# Patient Record
Sex: Female | Born: 1940 | Hispanic: Refuse to answer | Marital: Married
Health system: Midwestern US, Academic
[De-identification: ages and names within clinical notes are randomized; demographics above are authoritative.]

---

## 2016-09-04 IMAGING — CR LOW_EXM
3 series · 3 of 3 positions shown · non-contrast
Comparison: none

[foot]
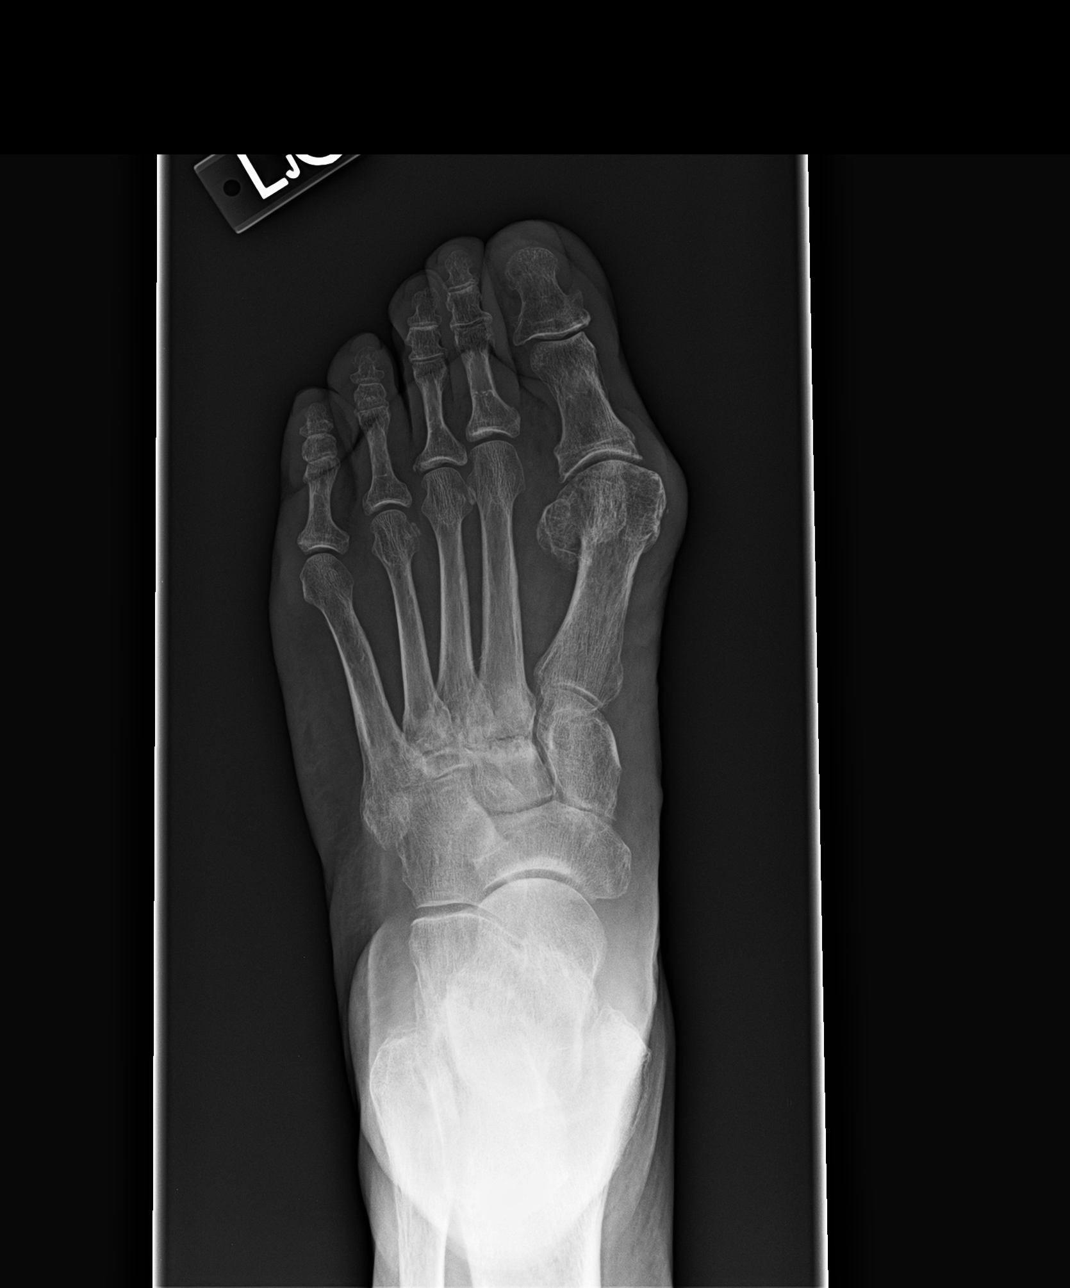

[foot obl]
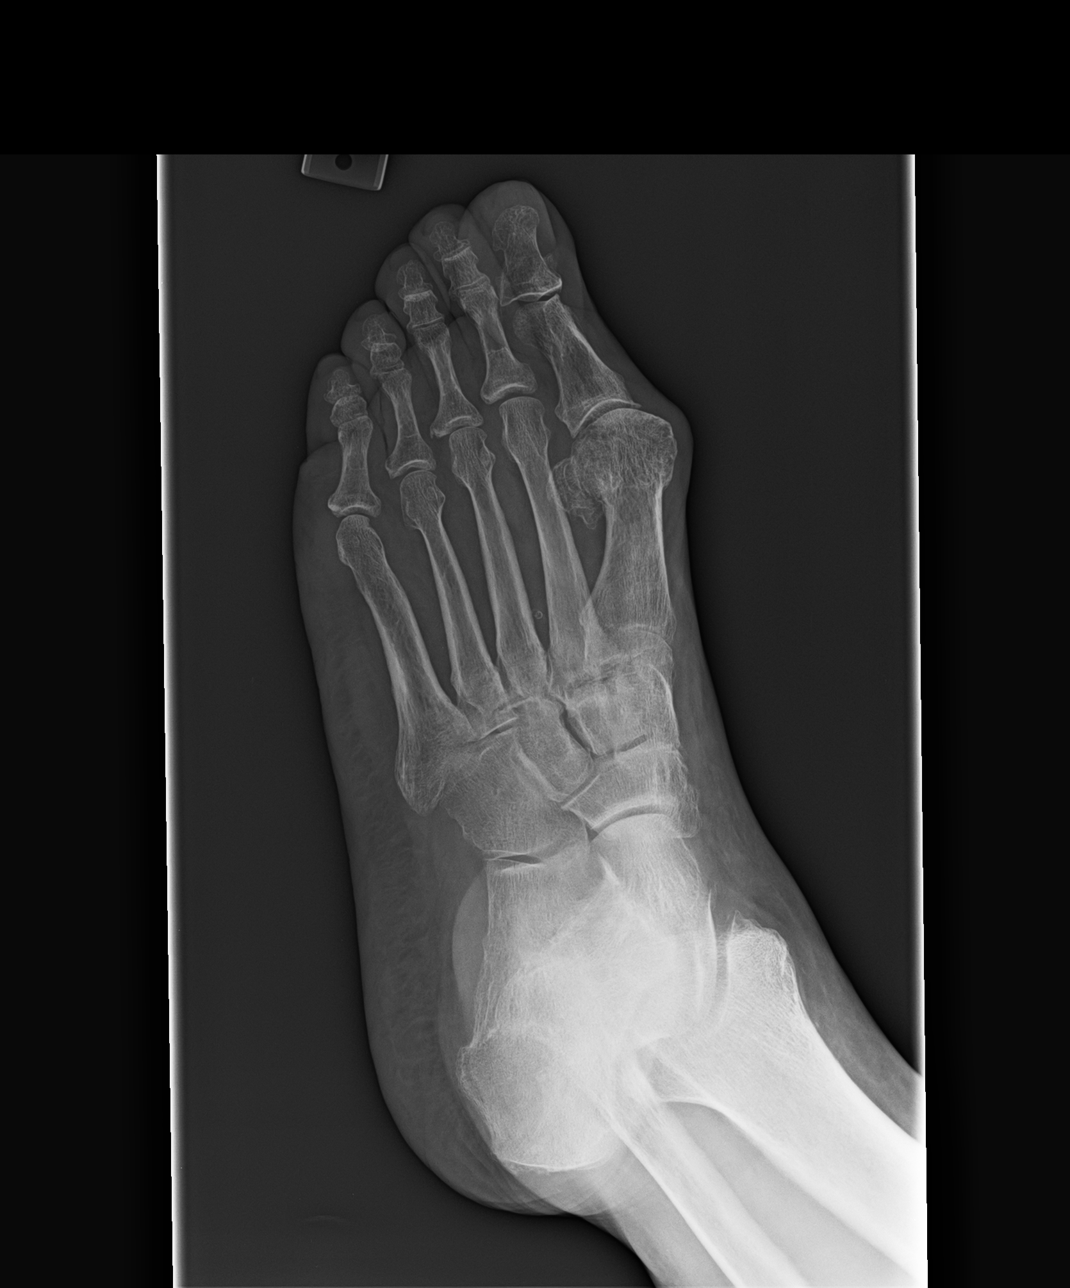

[foot lat]
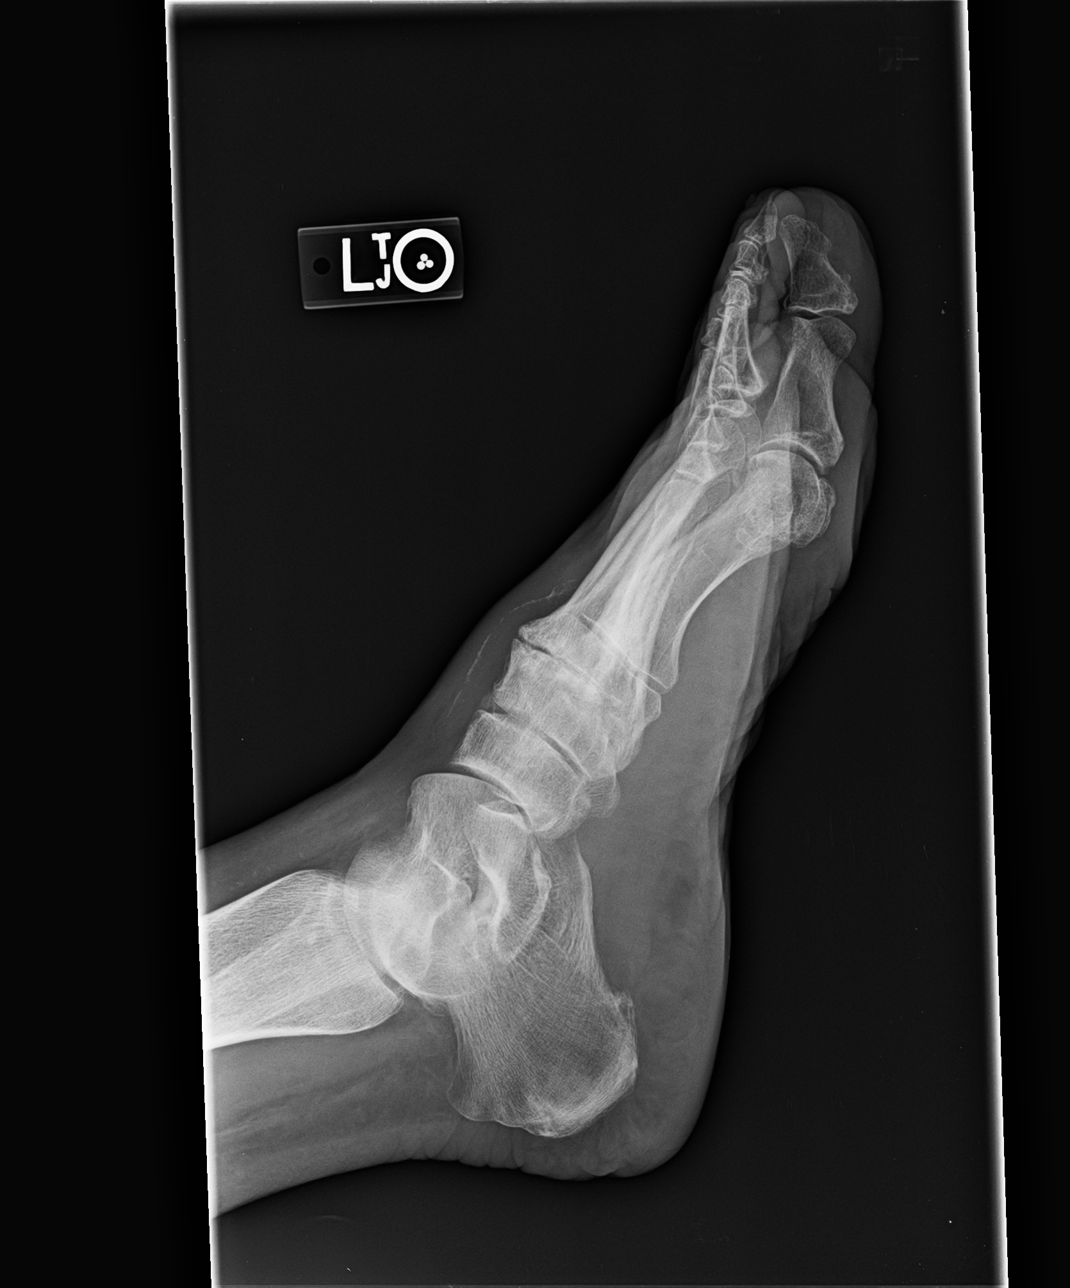

[3 of 3 positions shown; findings below may reference images not displayed]

EXAM
RADIOLOGICAL EXAMINATION, FOOT; COMPLETE
3 OR MORE VIEWS CPT 49493

INDICATION
LEFT FOOT PAIN
PT STATES HAS LEFT FOOT PAIN THROUGHOUT. NO KNOWN INJURY. TJ

TECHNIQUE
3 views of the left foot were acquired.

COMPARISONS
Previous examination dated 06/14/2015.

FINDINGS
There are no fractures or subluxations of the foot. Hallux valgus deformity is redemonstrated.
There is bony demineralization identified. There are advanced degenerative changes along the
midfoot involving the tarsometatarsal joints. Degenerative changes of the metacarpophalangeal and
interphalangeal joints are noted as well. Plantar calcaneal spur is redemonstrated.
There is soft tissue swelling along the dorsal aspect of the foot. Vascular calcifications are
noted as well. There are no abnormal masses.

IMPRESSION
Degenerative changes along the midfoot as previously described. Hallux valgus deformity. Plantar
calcaneal spur. Soft tissue swelling along the dorsal aspect of the left foot. No acute fractures.

## 2017-01-03 IMAGING — CR LOW_EXM
2 series · 2 of 2 positions shown · non-contrast
Comparison: none

[knee lat]
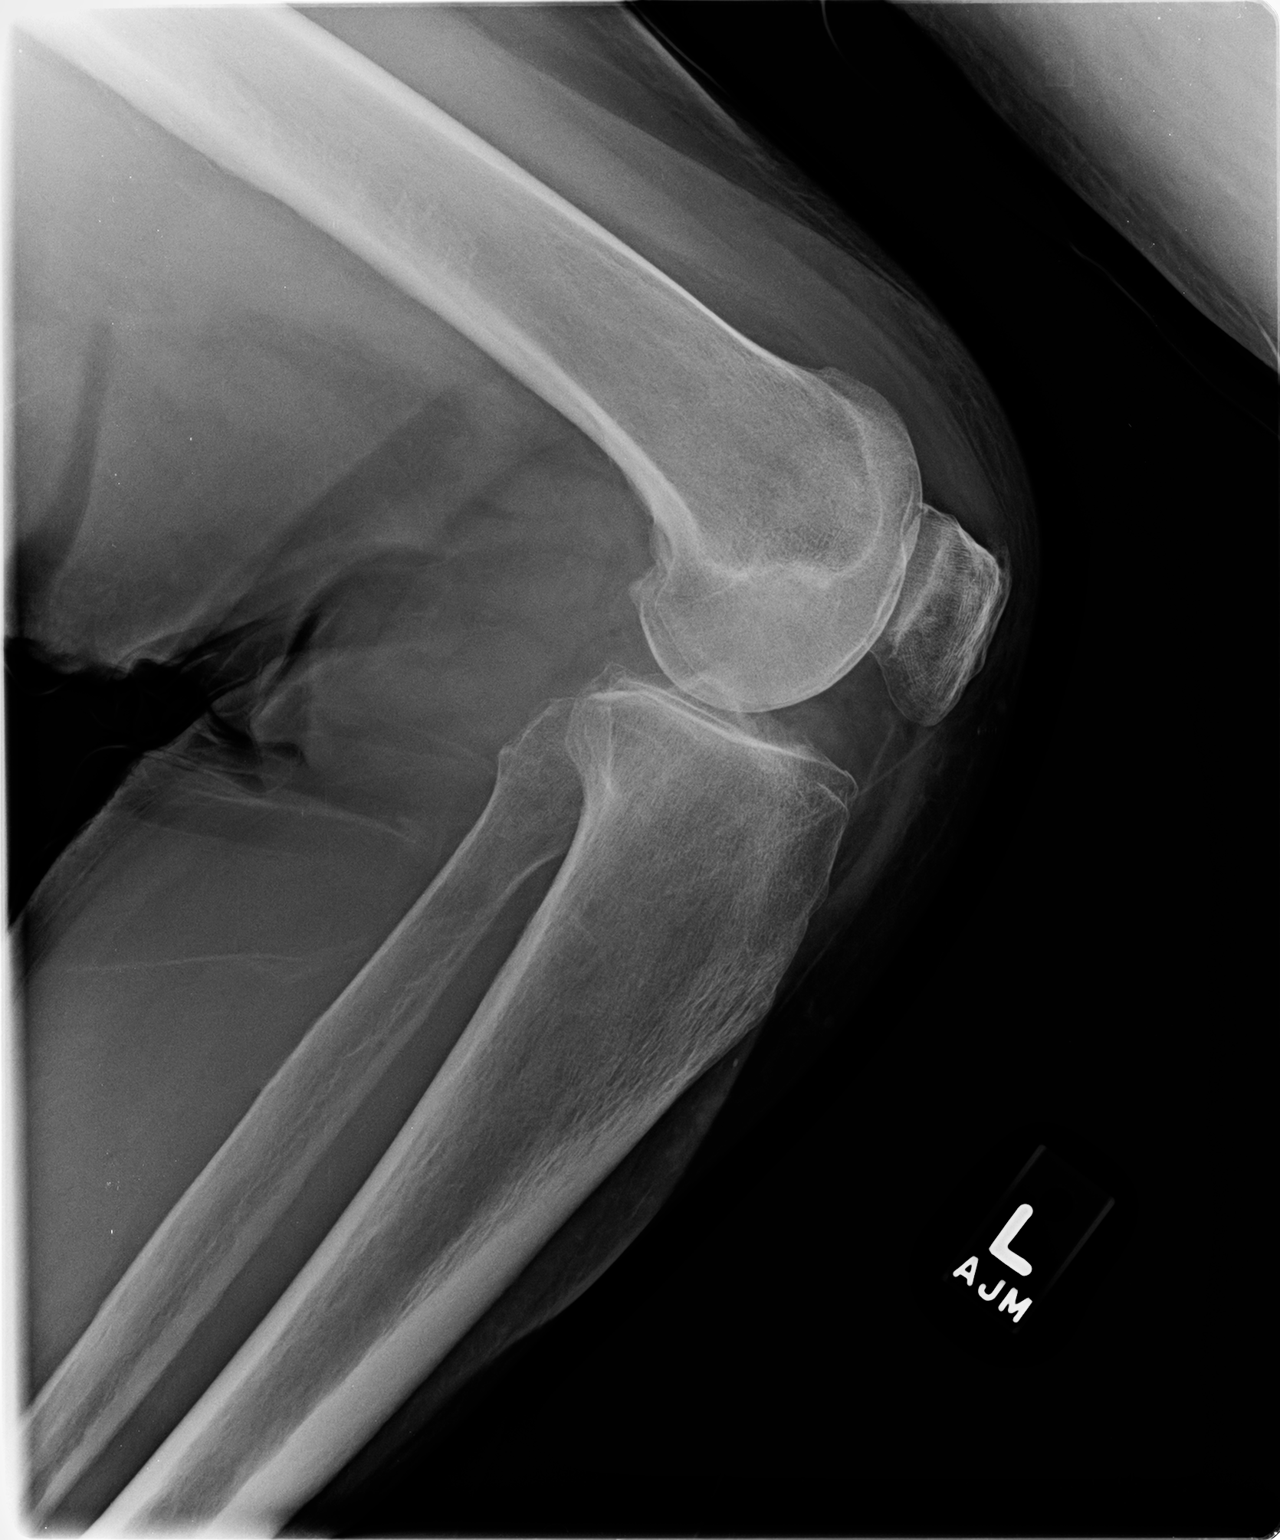

[knee sunrise]
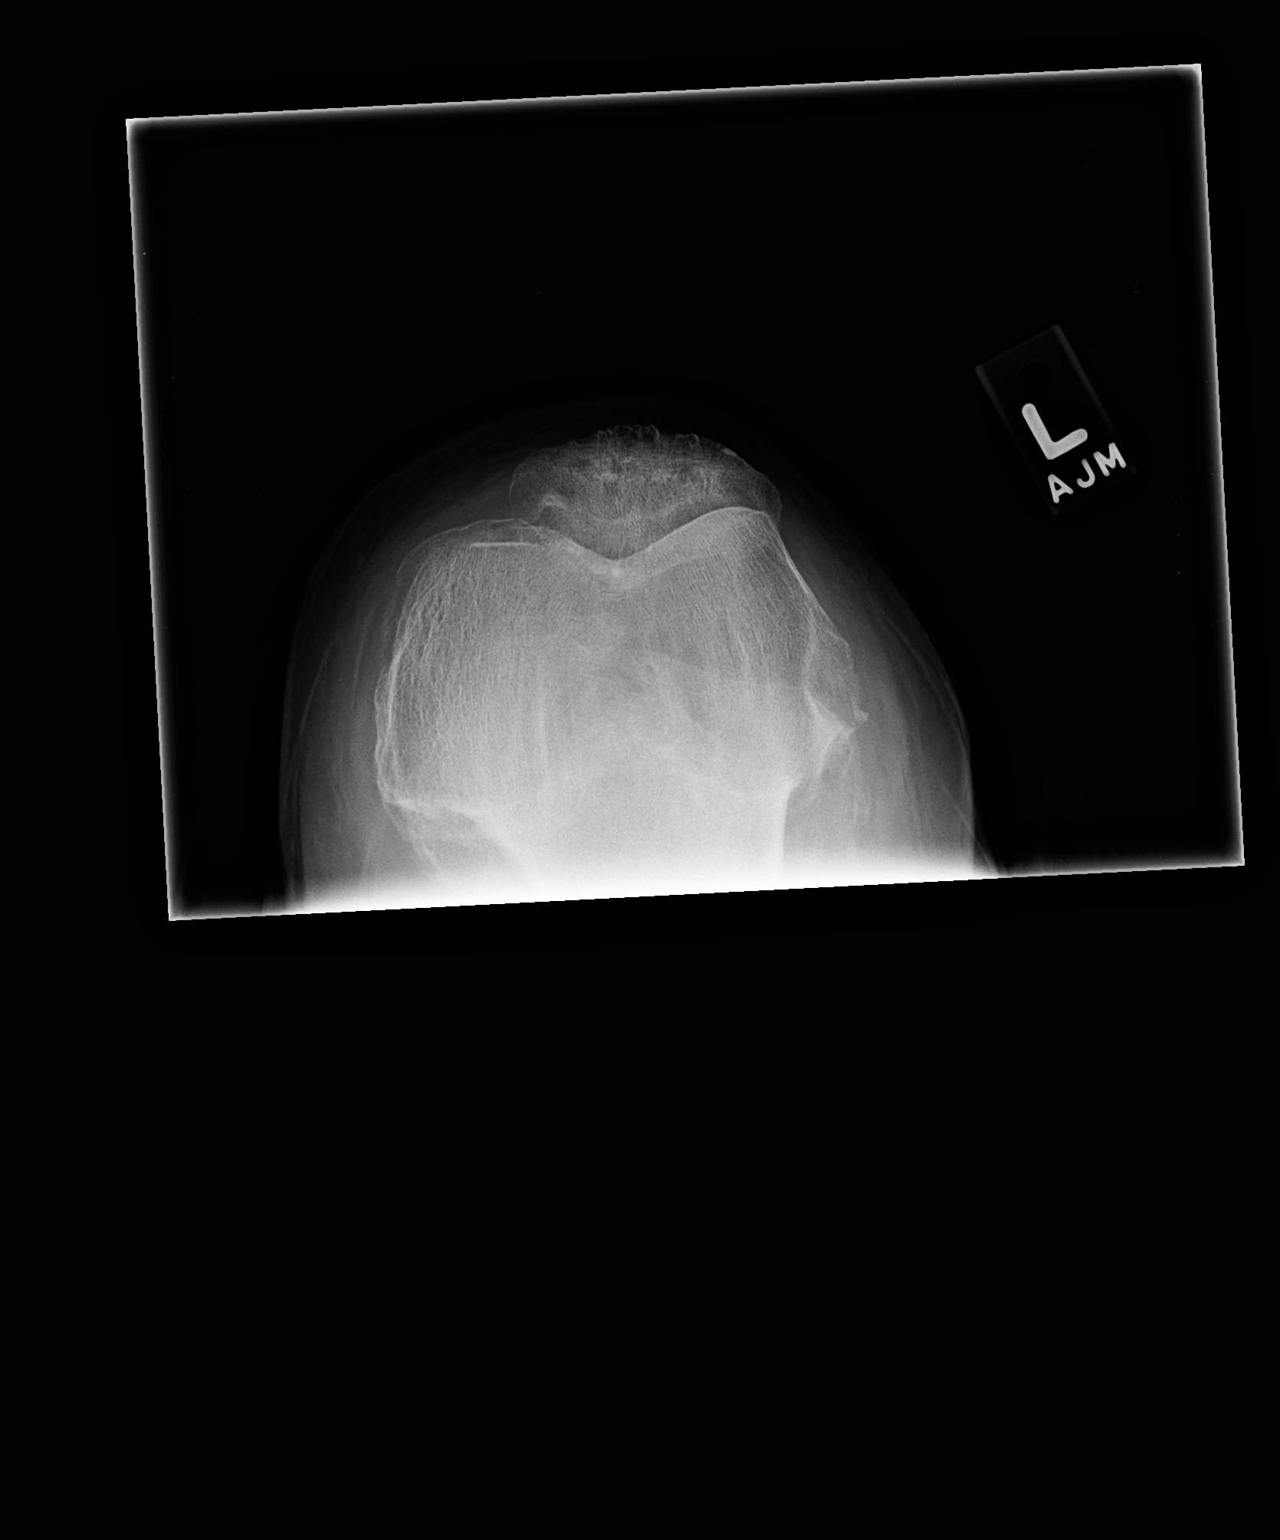

[2 of 2 positions shown; findings below may reference images not displayed]

DIAGNOSTIC STUDIES

EXAM

Bilateral knees:

INDICATION

bilateral knee pain
BILATERAL KNEE AND HIP PAIN

FINDINGS

Left knee: No acute fracture or dislocation. No significant joint effusion. Moderate loss of joint
space with subchondral sclerosis with patellofemoral compartment.

Right knee: No acute fracture or dislocation. No significant joint effusion. Moderate loss of
joint space with subchondral sclerosis within the patellofemoral compartment.

IMPRESSION

Moderate degree of osteoarthritic changes within the patellofemoral compartment bilaterally.

## 2017-01-03 IMAGING — CR LOW_EXM
1 series · 1 of 1 positions shown · non-contrast
Comparison: none

[ap bilateral standing knees]
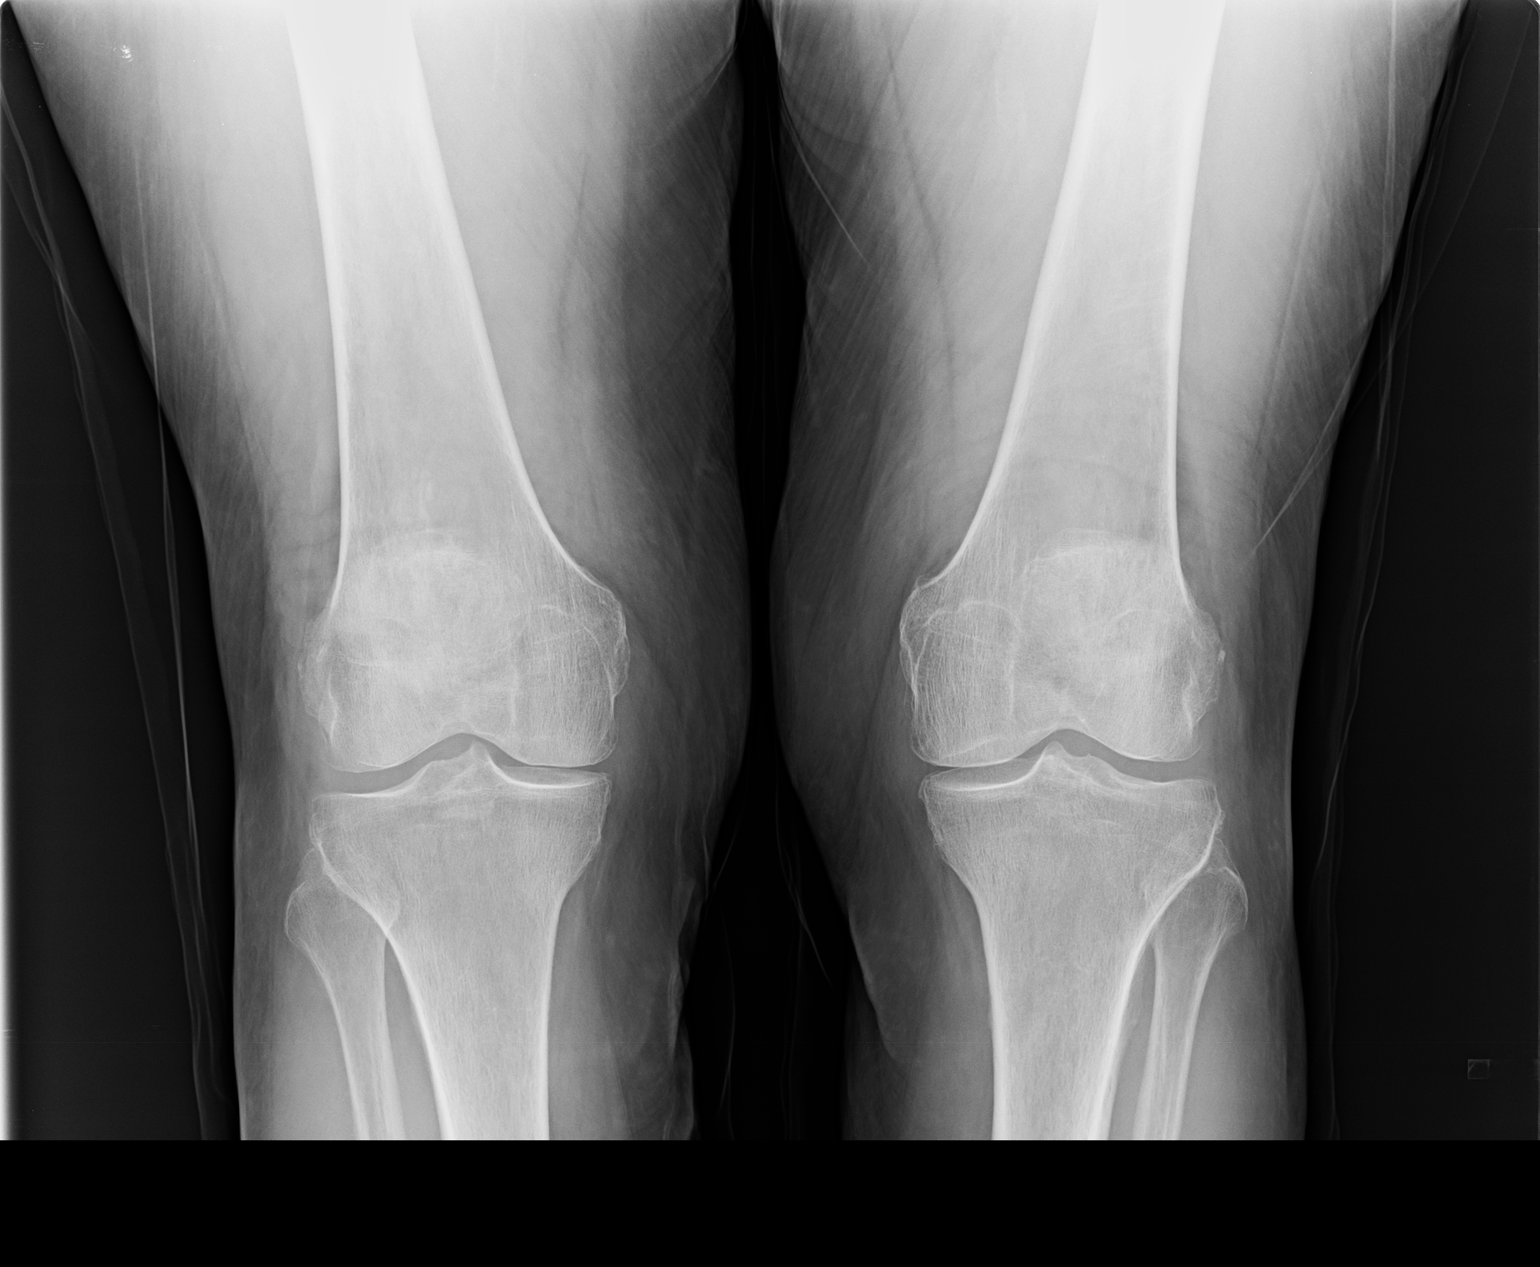

[1 of 1 positions shown; findings below may reference images not displayed]

DIAGNOSTIC STUDIES

EXAM

Bilateral knees:

INDICATION

bilateral knee pain
BILATERAL KNEE AND HIP PAIN

FINDINGS

Left knee: No acute fracture or dislocation. No significant joint effusion. Moderate loss of joint
space with subchondral sclerosis with patellofemoral compartment.

Right knee: No acute fracture or dislocation. No significant joint effusion. Moderate loss of
joint space with subchondral sclerosis within the patellofemoral compartment.

IMPRESSION

Moderate degree of osteoarthritic changes within the patellofemoral compartment bilaterally.

## 2017-01-03 IMAGING — CR LOW_EXM
2 series · 2 of 2 positions shown · non-contrast
Comparison: none

[knee lat]
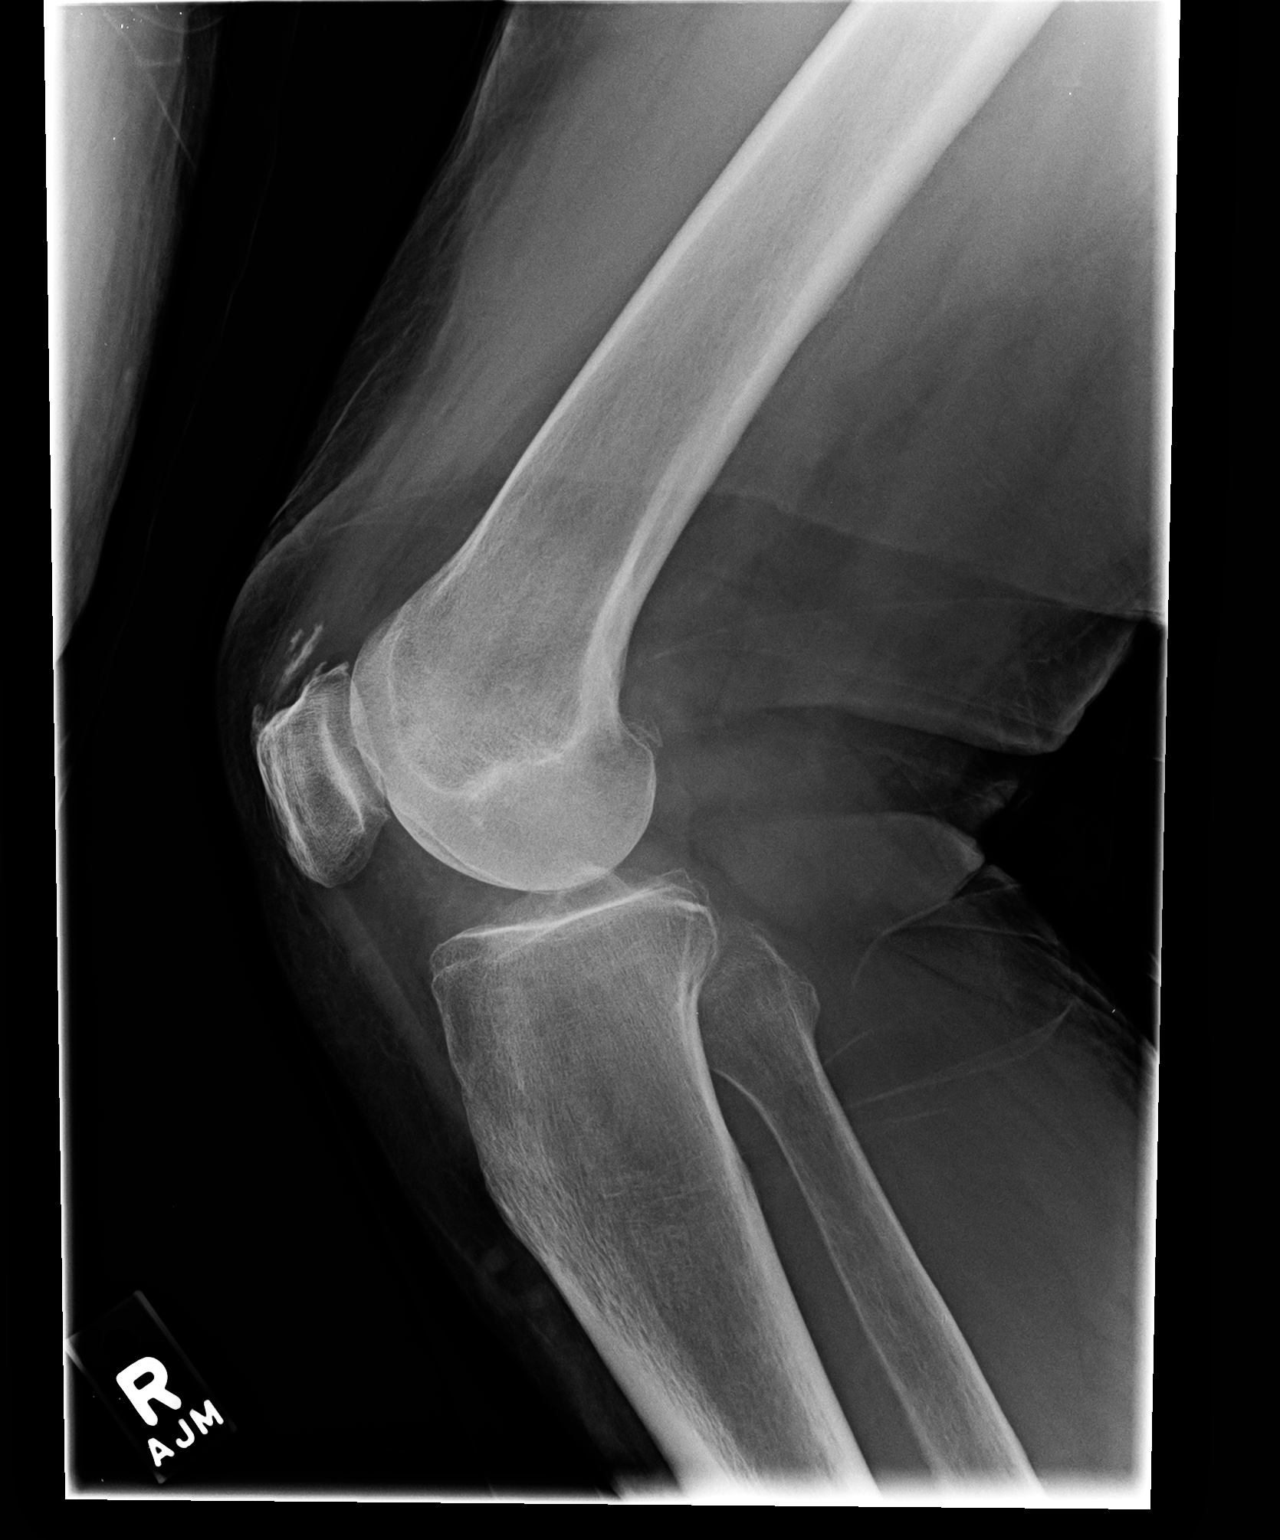

[knee sunrise]
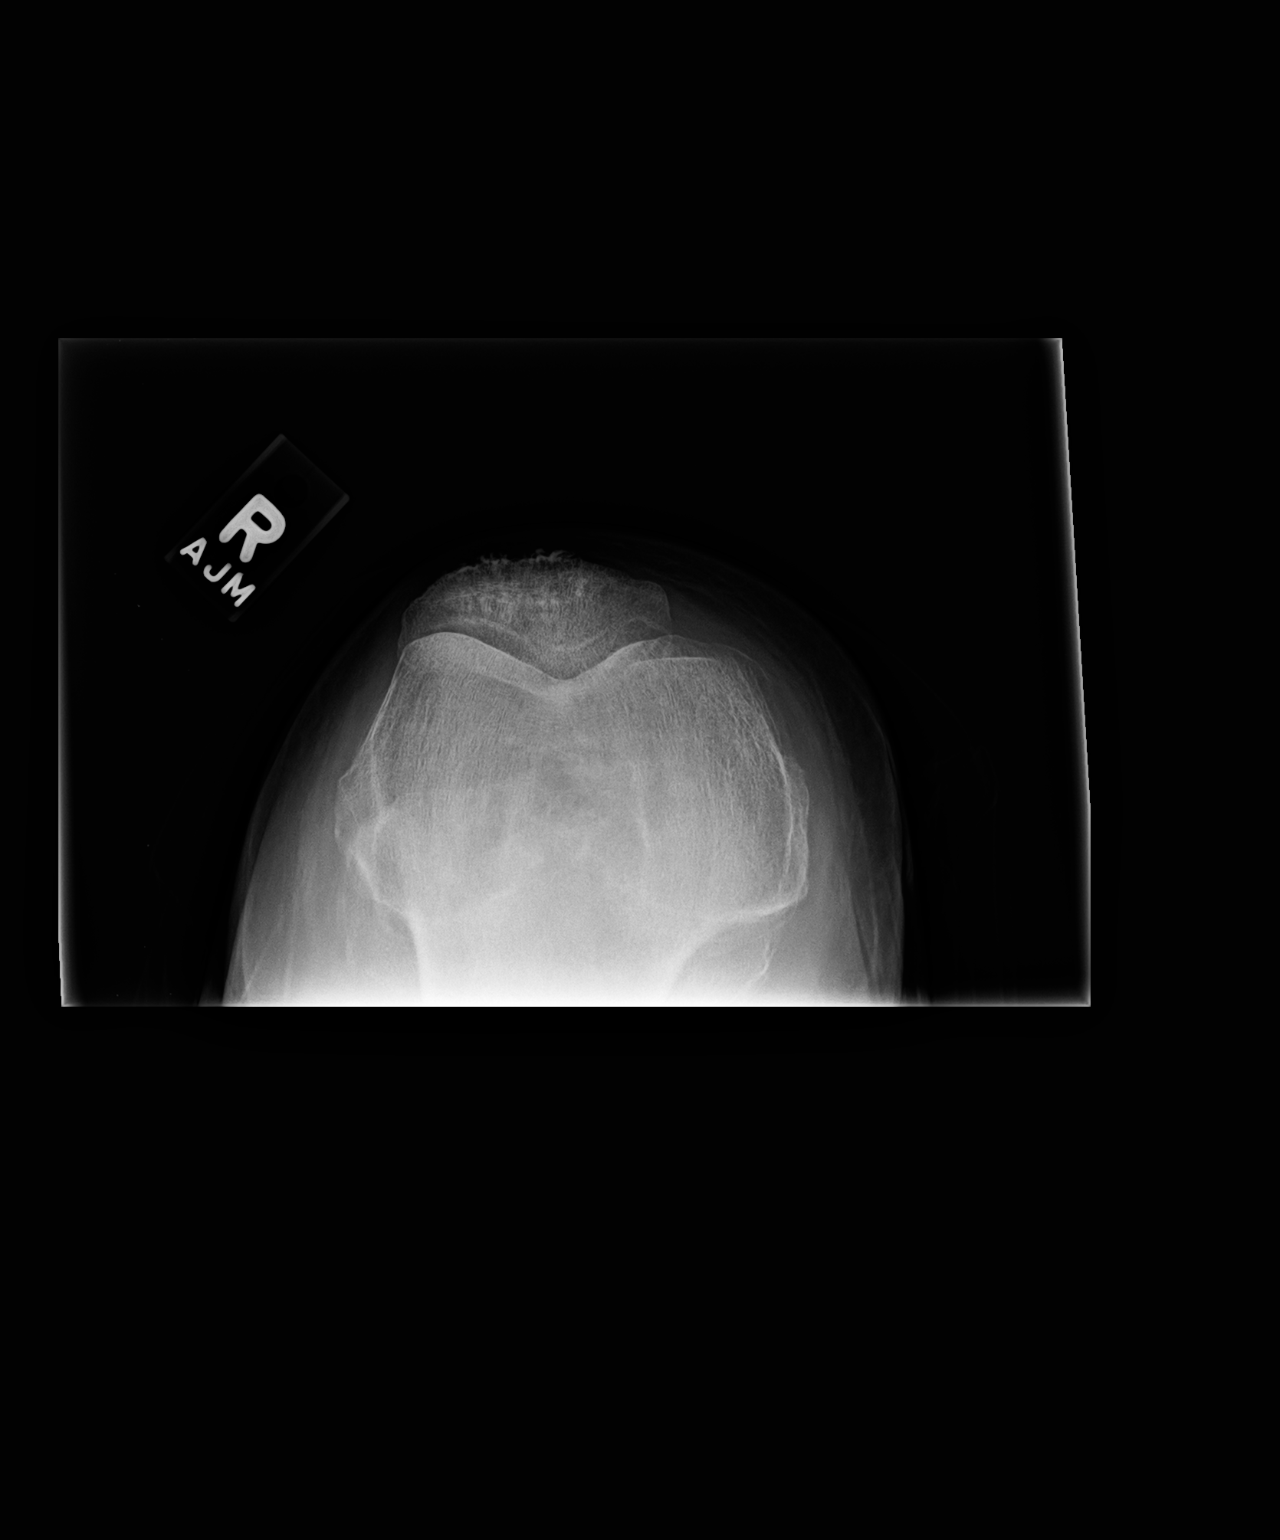

[2 of 2 positions shown; findings below may reference images not displayed]

DIAGNOSTIC STUDIES

EXAM

Bilateral knees:

INDICATION

bilateral knee pain
BILATERAL KNEE AND HIP PAIN

FINDINGS

Left knee: No acute fracture or dislocation. No significant joint effusion. Moderate loss of joint
space with subchondral sclerosis with patellofemoral compartment.

Right knee: No acute fracture or dislocation. No significant joint effusion. Moderate loss of
joint space with subchondral sclerosis within the patellofemoral compartment.

IMPRESSION

Moderate degree of osteoarthritic changes within the patellofemoral compartment bilaterally.

## 2017-11-21 IMAGING — CR LOW_EXM
5 series · 5 of 5 positions shown · non-contrast
Comparison: none

[femur ap proximal]
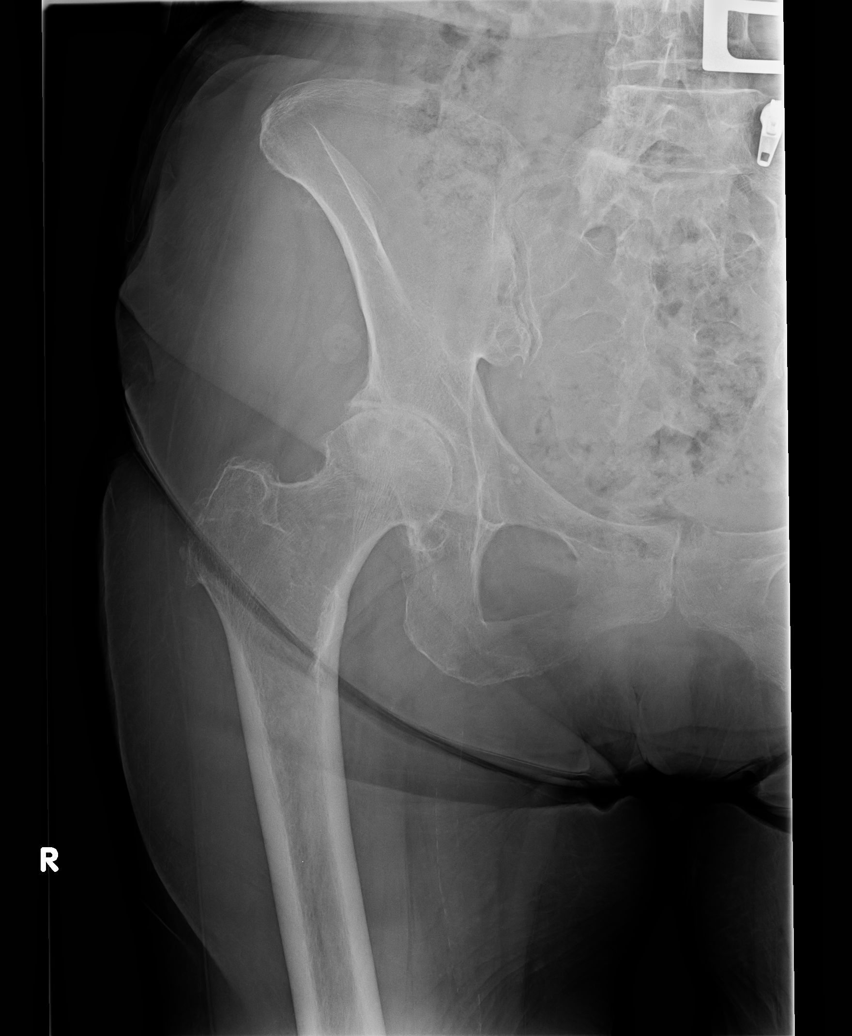

[femur ap distal (1 of 2)]
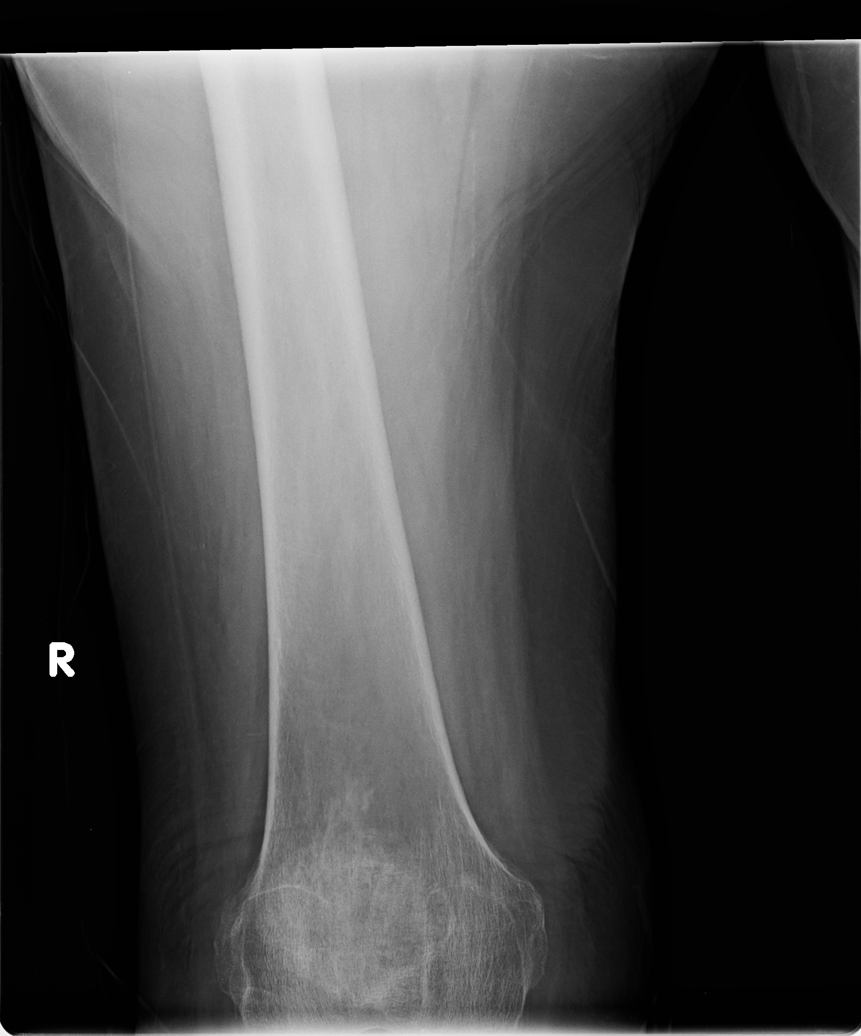

[femur ap distal (2 of 2)]
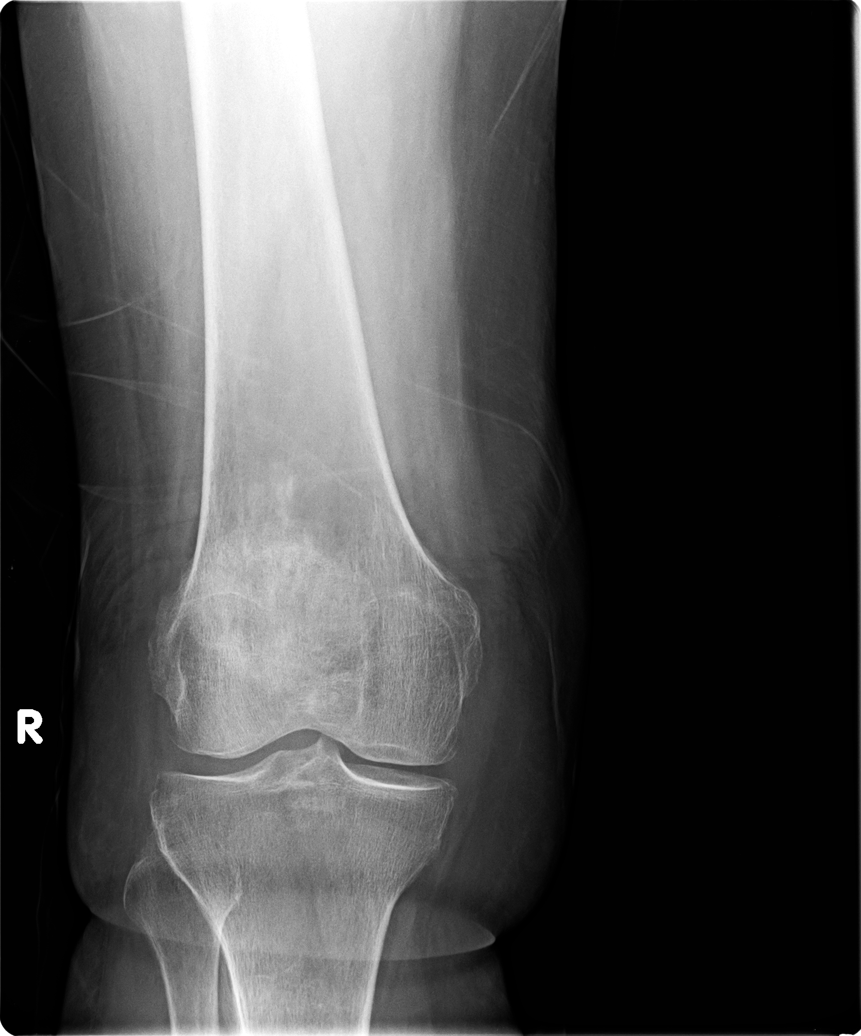

[femur lat proximal]
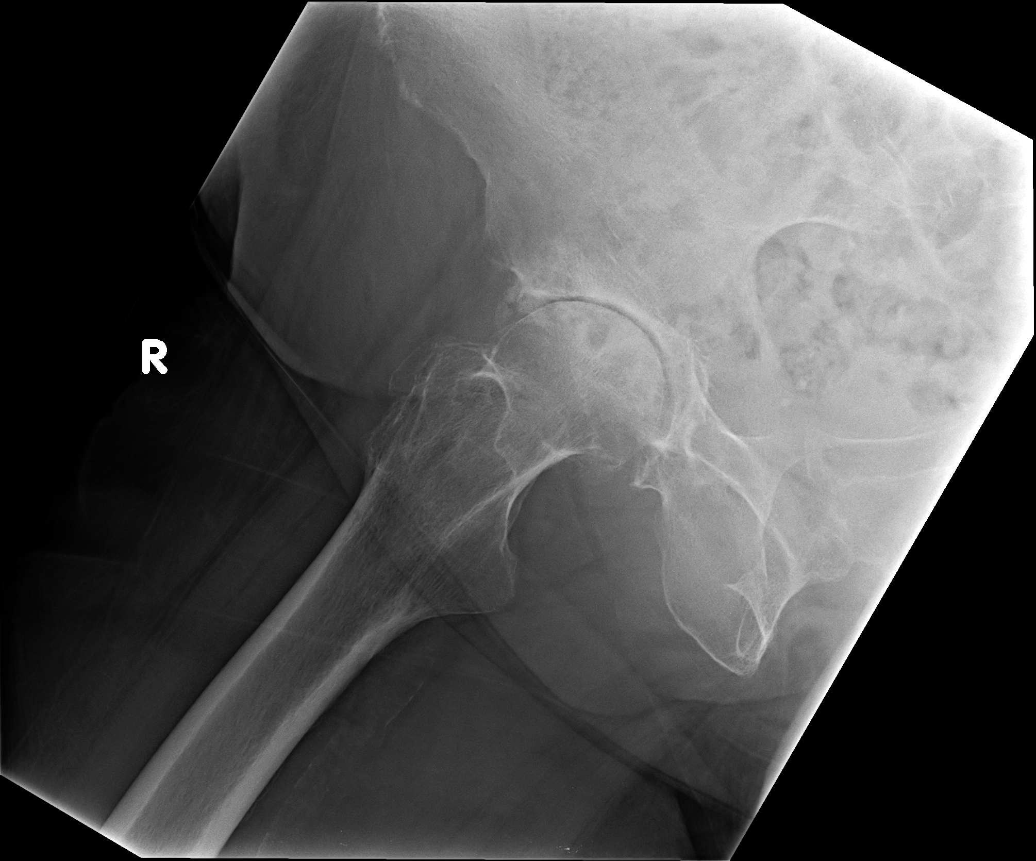

[femur lat distal]
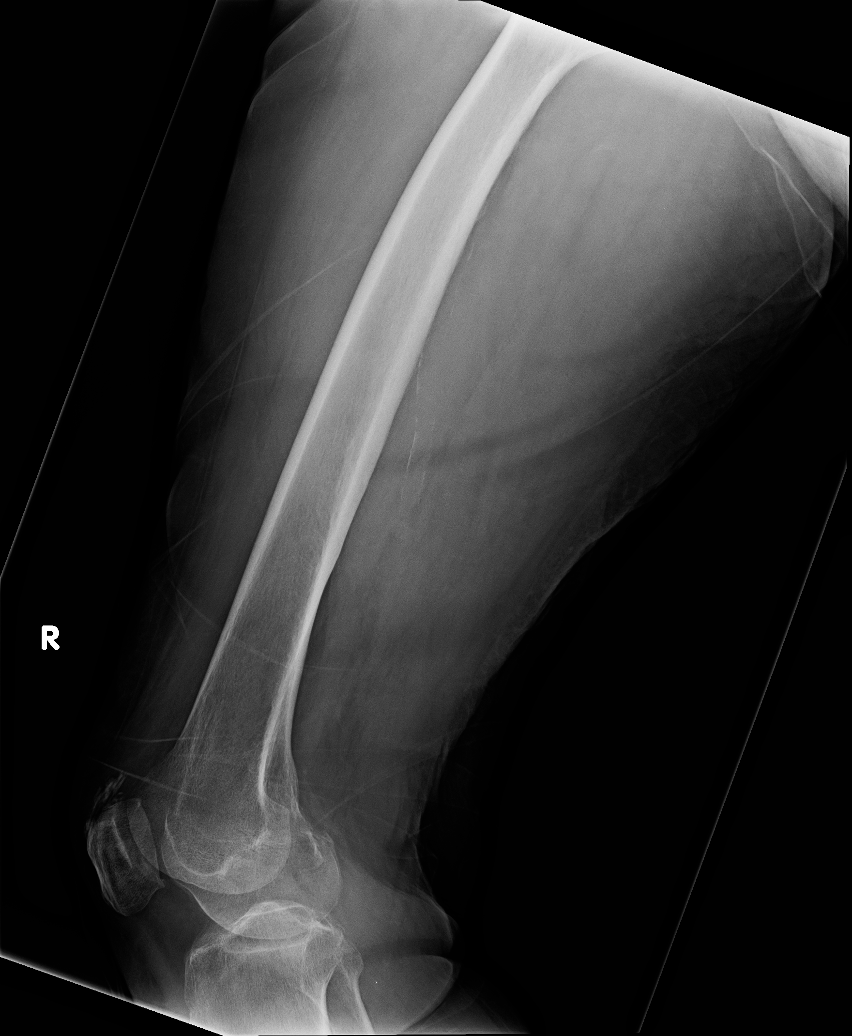

[5 of 5 positions shown; findings below may reference images not displayed]

DIAGNOSTIC STUDIES

EXAM

Two-view right femur

INDICATION

Right femur pain

TECHNIQUE

Frontal and lateral views

COMPARISONS

Right hip and knee radiographs July 16, 2017

FINDINGS

Right hip and knee osteoarthritis are present, hip is bone-on-bone articulation. Diffuse
osteopenia. No acute displaced fracture. Cannot exclude femoral head osteonecrosis given the mixed
sclerotic and lucent appearance of the superior femoral head. No collapse.

IMPRESSION

Bone-on-bone right hip osteoarthritis without acute displaced fracture or malalignment.

## 2018-04-27 IMAGING — CR ABDOMEN
2 series · 2 of 2 positions shown · non-contrast
Comparison: none

PROCEDURE: ABDOMEN
HISTORY: Worsening abd pain, nausea. Diarrhea yesterday. Pain all over per pt. Hx gastric bypass,
hysterectomy, appy per pt.

[abdomen supine kub (1 of 2)]
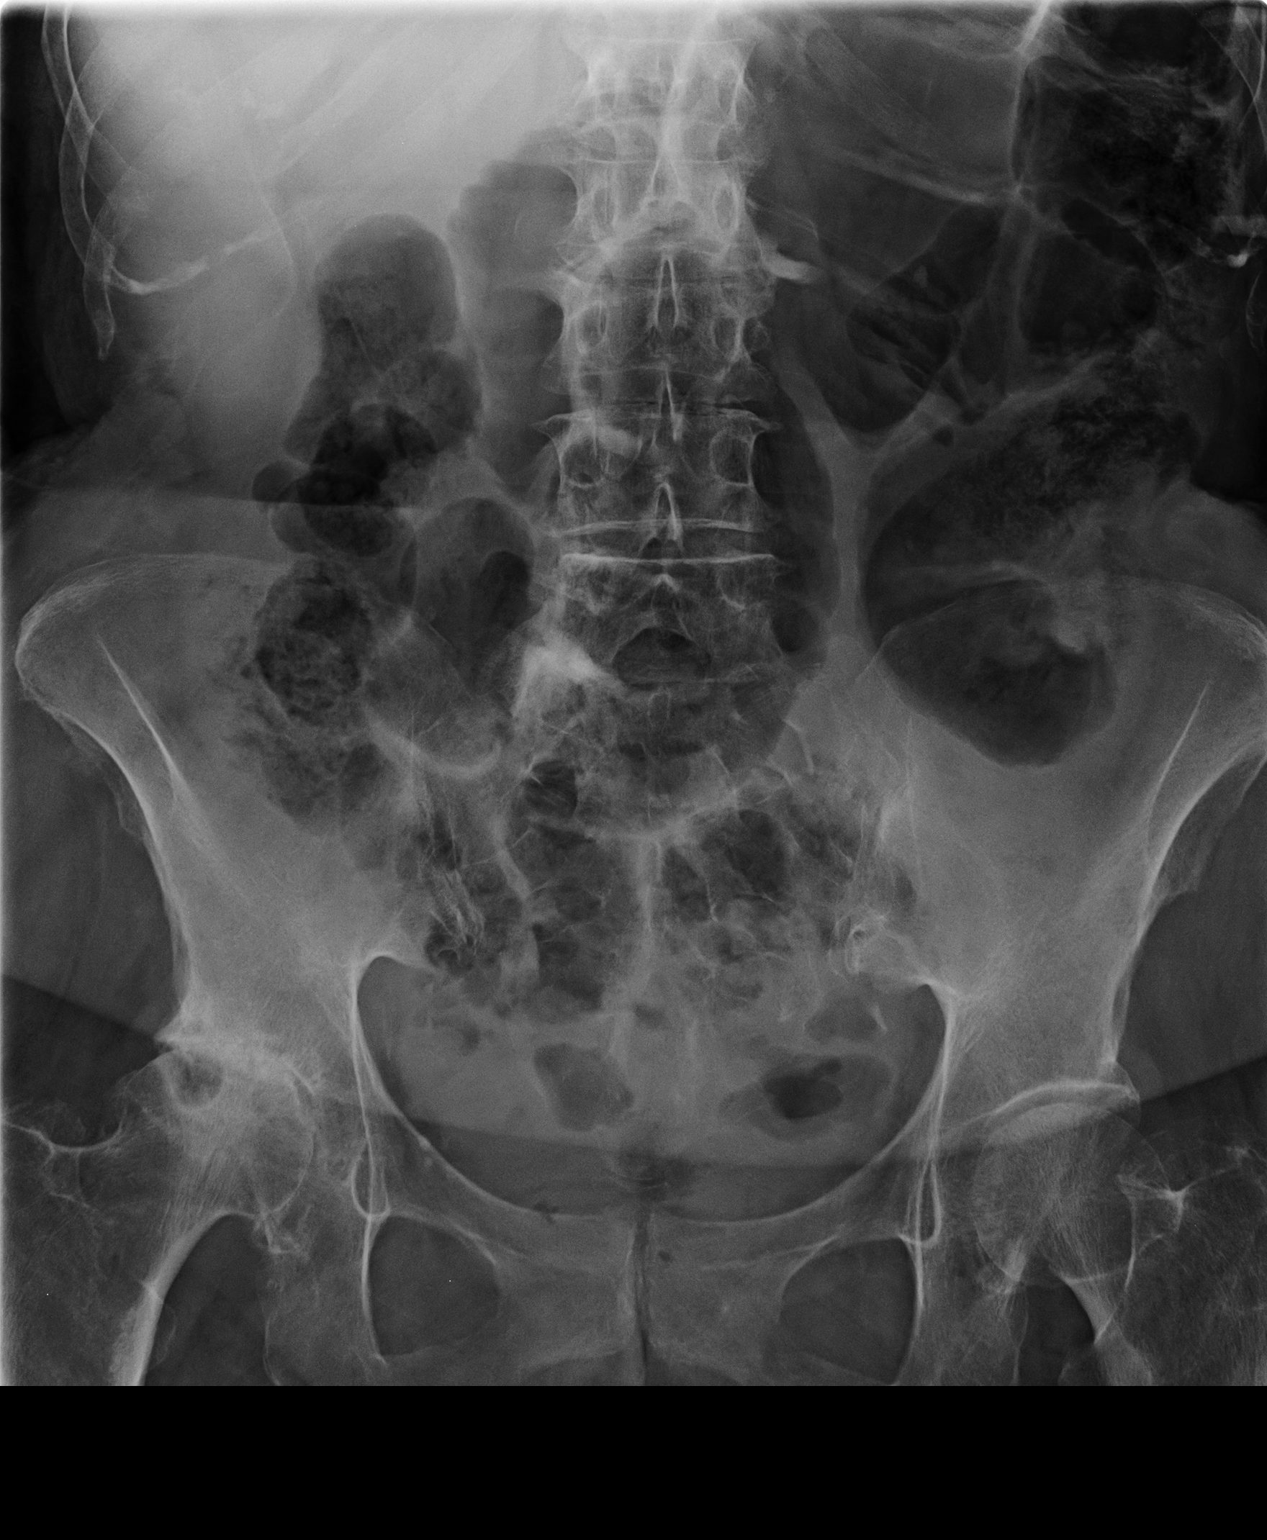

[abdomen supine kub (2 of 2)]
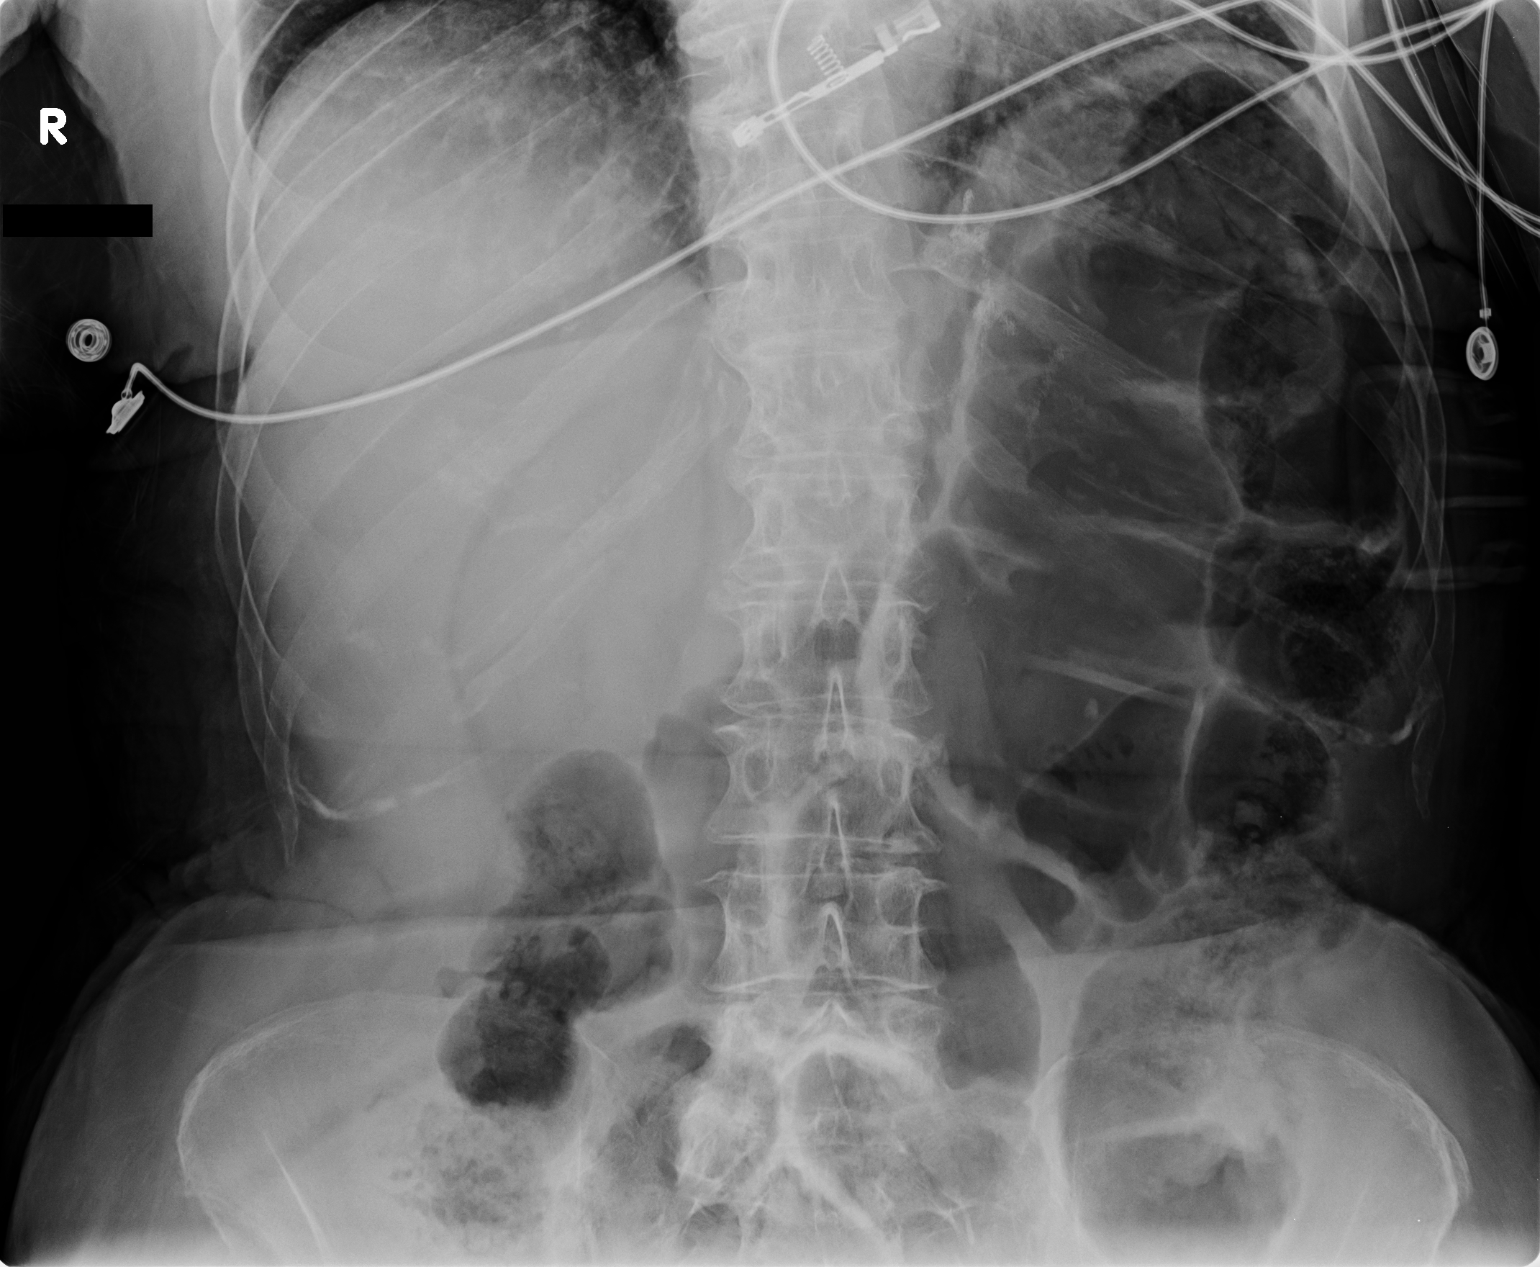

[2 of 2 positions shown; findings below may reference images not displayed]

FINDINGS: 2 images of the abdomen without comparison shows a nonspecific bowel gas
pattern without pathologic dilation. Intraluminal air filled loops of bowel are seen down to
the level of rectum. Scattered moderate amount of stool is seen throughout the colon and
into the rectal vault. No pathologic calcifications are seen. No free air seen by plain film
exam. Severe degenerative changes are seen of the right hip with subcortical cyst.
IMPRESSION: Nonobstructive bowel gas pattern without free air.

Tech Notes:

ALL OVER ABD PAIN, DIARRHEA. WORSE AT THIS TIME.  HX HYSTERECTOMY, GASTRIC BYPASS, APPY PER PT.

## 2018-04-28 IMAGING — US ABDLM
1 series · 14 of 25 positions shown · non-contrast
Comparison: none

[Series 1: us abdomen limited · 14 of 75 slices shown]
[im 1/75]
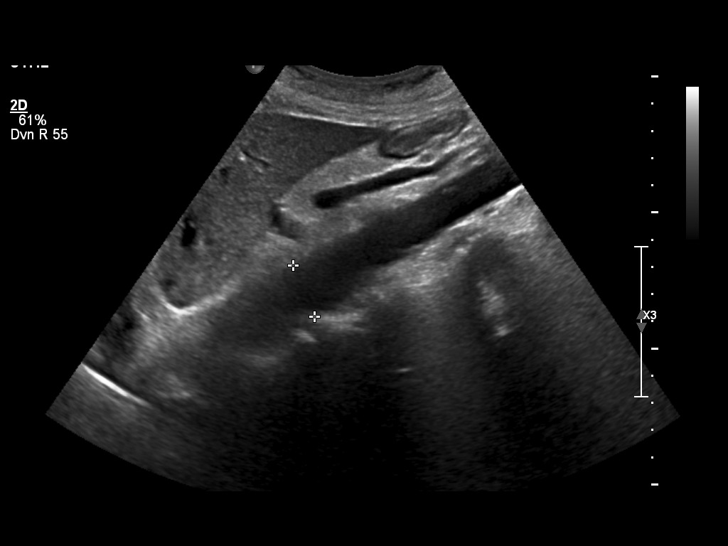
[im 7/75]
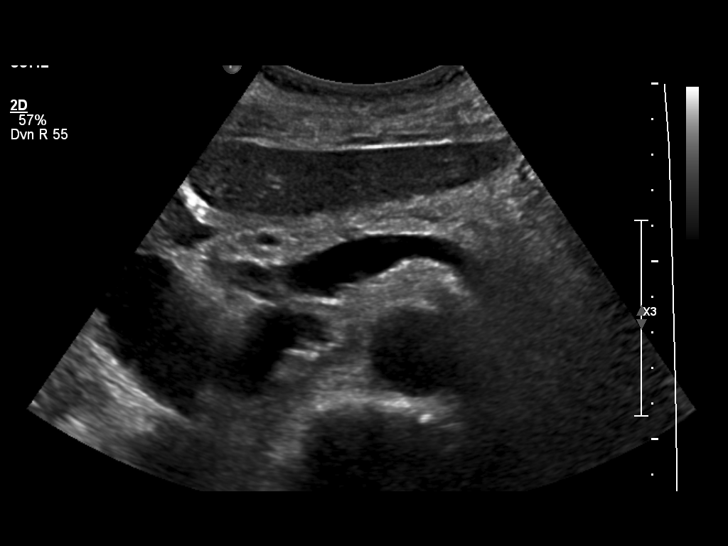
[im 13/75]
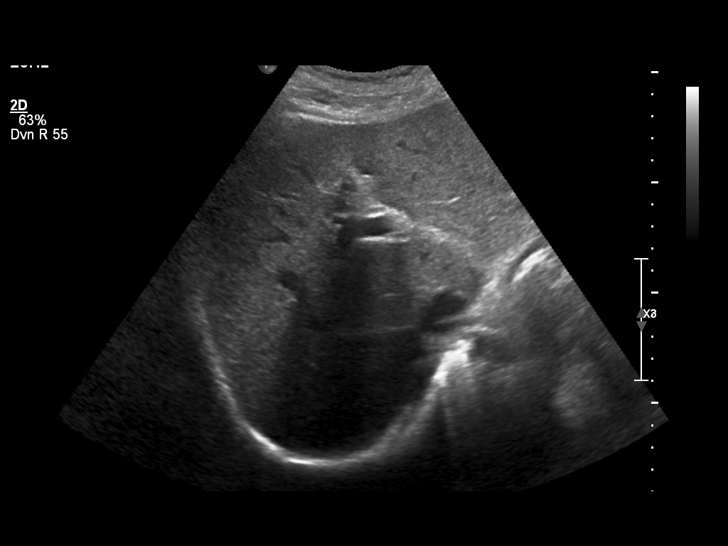
[im 19/75]
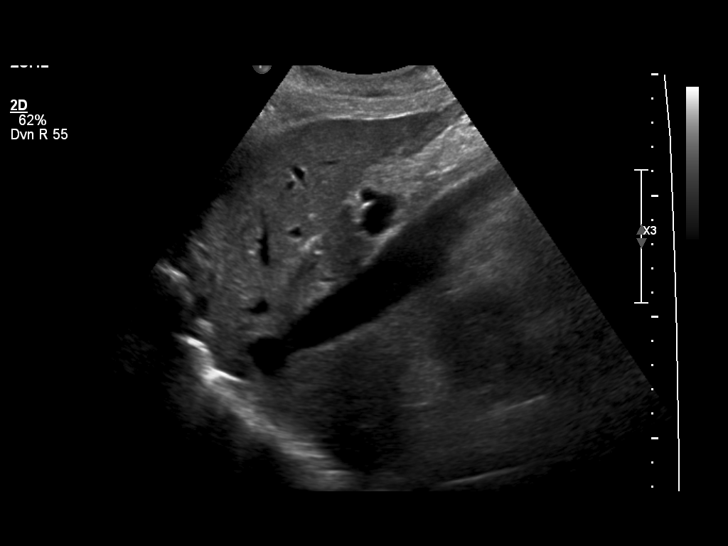
[im 25/75]
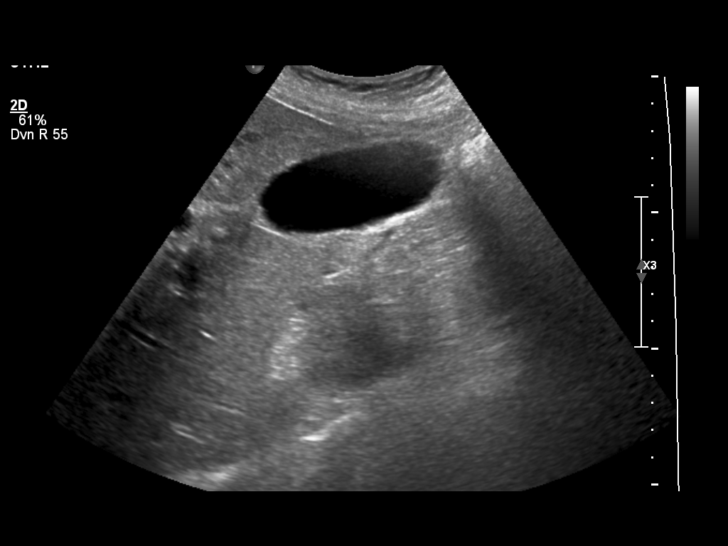
[im 28/75]
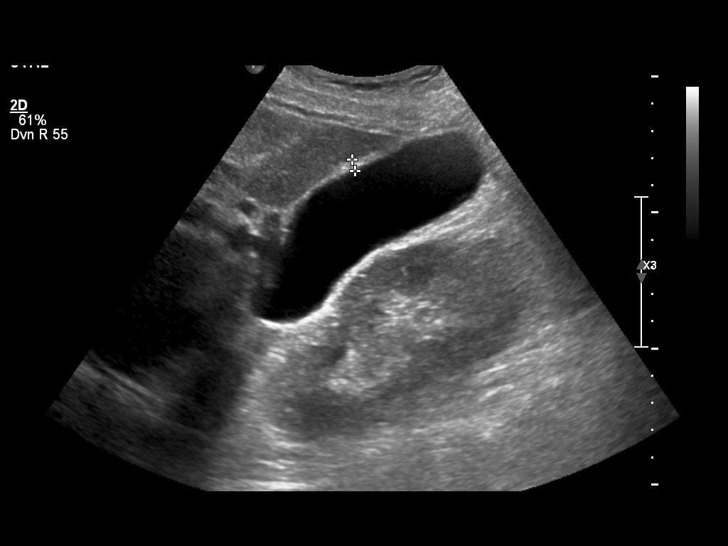
[im 34/75]
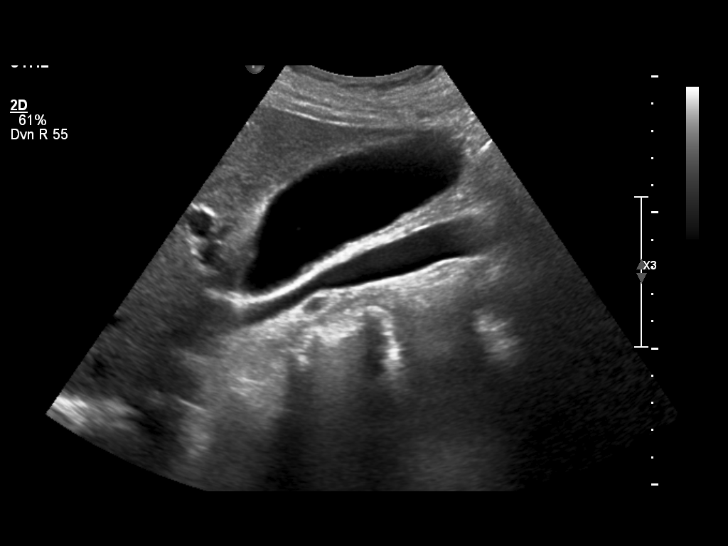
[im 41/75]
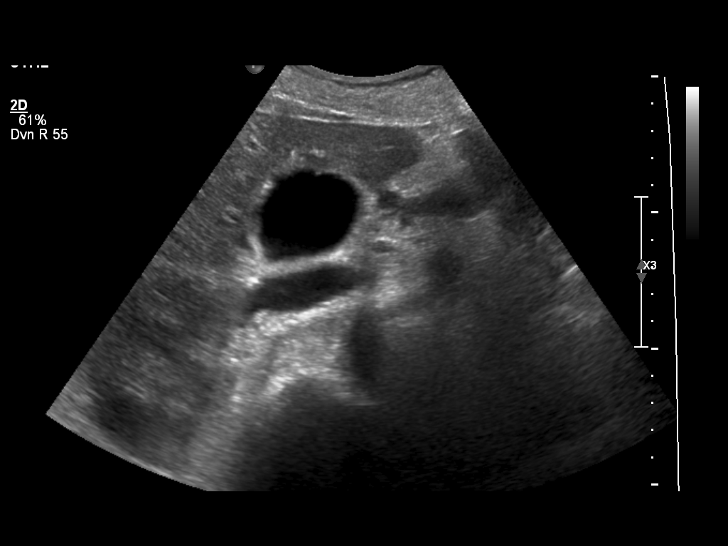
[im 47/75]
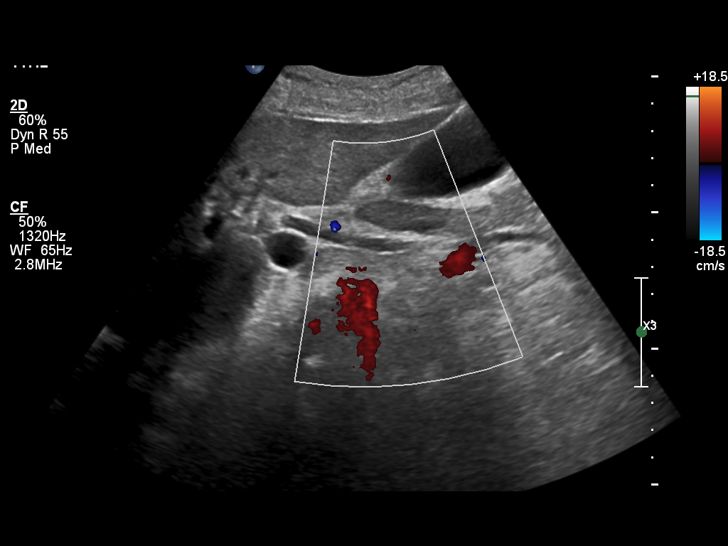
[im 50/75]
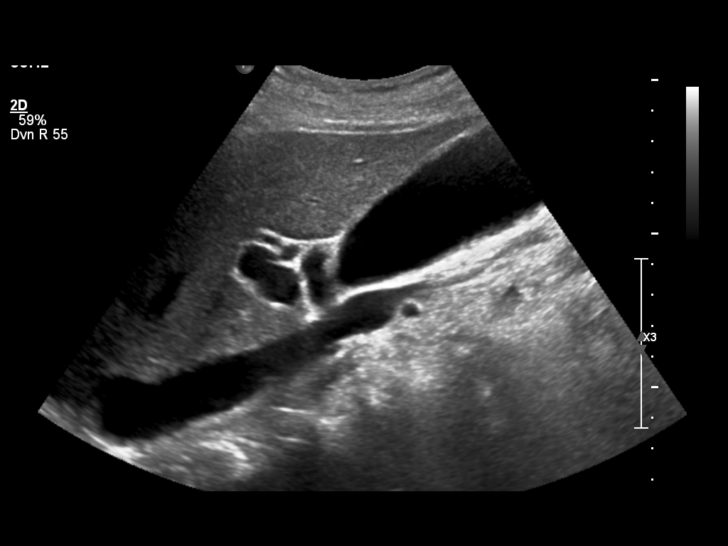
[im 56/75]
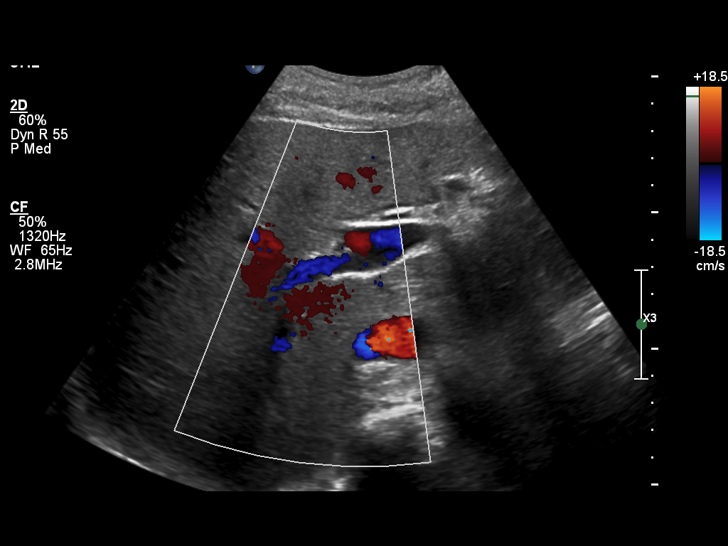
[im 62/75]
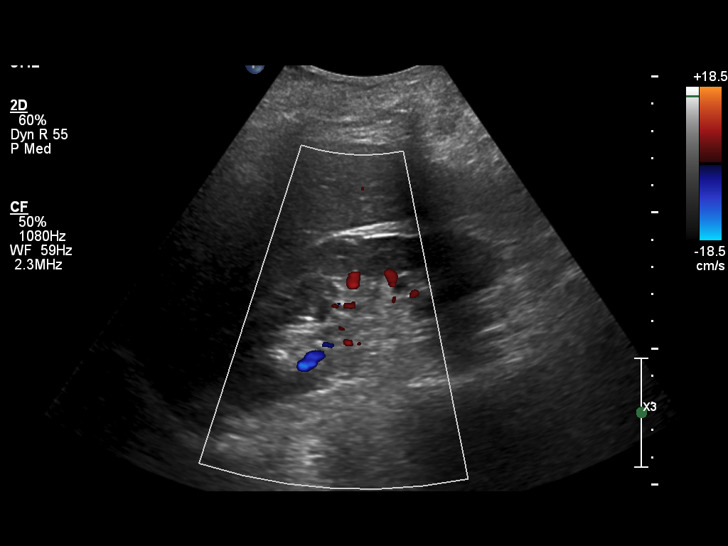
[im 68/75]
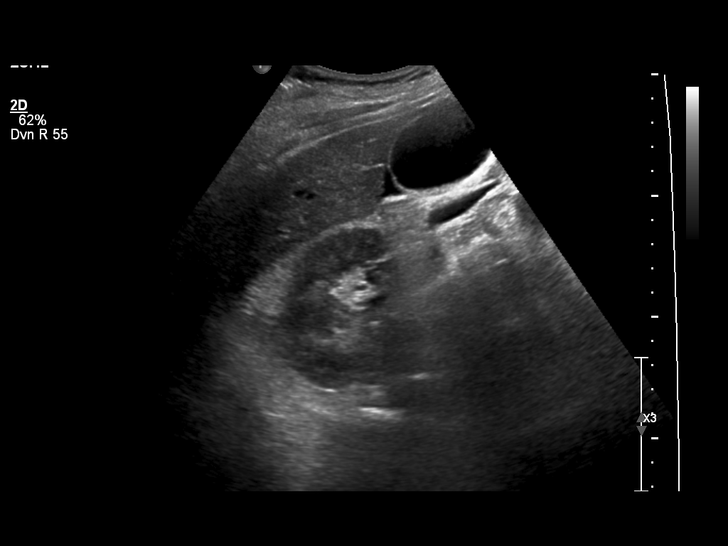
[im 75/75]
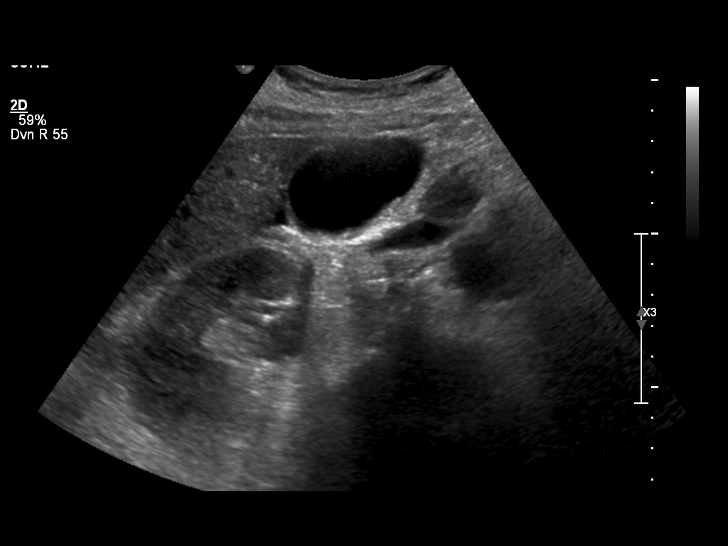

[14 of 25 positions shown; findings below may reference images not displayed]

DIAGNOSTIC STUDIES

EXAM
ULTRASOUND, ABDOMINAL, REAL TIME WITH IMAGE DOCUMENTATION, COMPLETE; CPT 05044

INDICATION
RUQ painjl

TECHNIQUE
Sonographic evaluation of the abdomen was performed with multiple gray scale and color Doppler
images.

COMPARISONS
No priors available for comparison.

FINDINGS
The liver has a homogeneous echotexture. No biliary ductal dilatation. Hydropic gallbladder,
centimeters. No gallstones. There is mild gallbladder wall thickening, 4.3 millimeters. Limited
evaluation of the pancreas secondary to overlying bowel gas. The right kidney, abdominal aorta and
IVC appear grossly unremarkable.

IMPRESSION
[1. Hydropic gallbladder, 10.3 centimeters. No gallstones. There is mild gallbladder wall
thickening, 4.3 millimeters.
Follow up with a surgical consultation and HIDA scan is recommended to exclude acalculous
cholecystitis.

Tech Notes:

jl

## 2018-04-28 IMAGING — CT Abdomen^1_ABDOMEN_PELVIS_WITH (Adult)
1 of 2 series · 14 of 32 positions shown, 19 images · IV contrast (APPLIED)
Comparison: none

PROCEDURE: Abdomen 1_ABDOMEN_PELVIS_WITH (Adult)
HISTORY: ALL OVER ABD PAIN, NAUSEA, DIARRHEA YESTERDDAY. INCREASED LIPASE. HX
GASTRIC BYPASS, HYSTERECTOMY, APPY PER PT. 75 ML OMNIPAQUE 300. GFR 64.2, CREAT
0.77, LIPASE 108
TECHNIQUE: Axial CT imaging of the abdomen and pelvis was performed with IV contrast.

[Series 2: abd/pelvis with 5.0 soft tissue · axial · 0.83mm/px · z∈[-434,-14]mm · 14 of 94 slices shown, 19 images]
[im 5/94  soft-tissue]
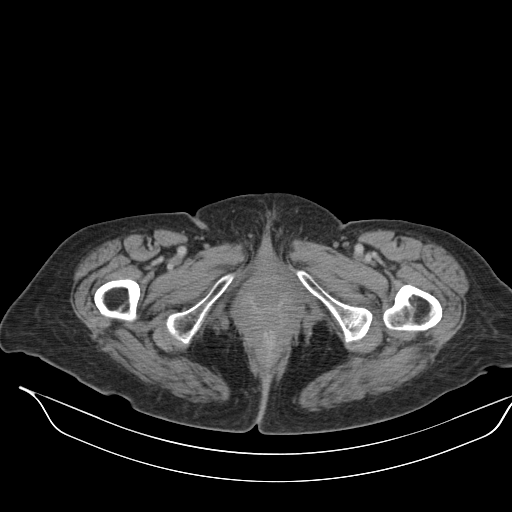
[im 5/94  bone]
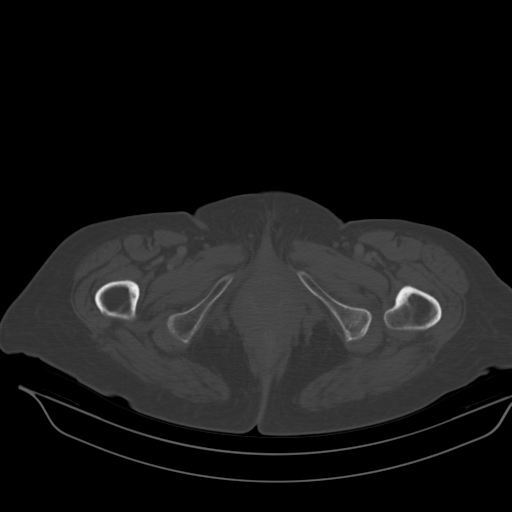
[im 14/94  soft-tissue]
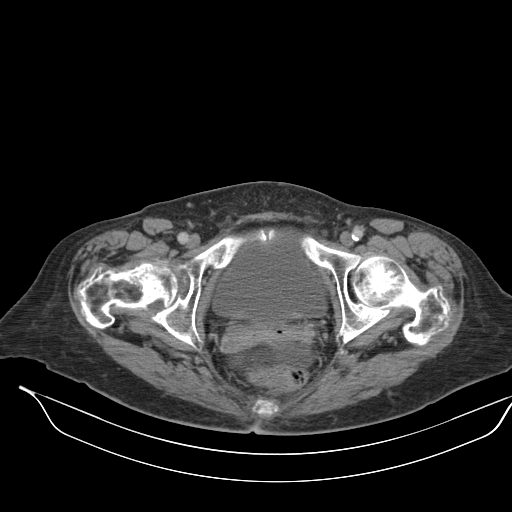
[im 19/94  soft-tissue]
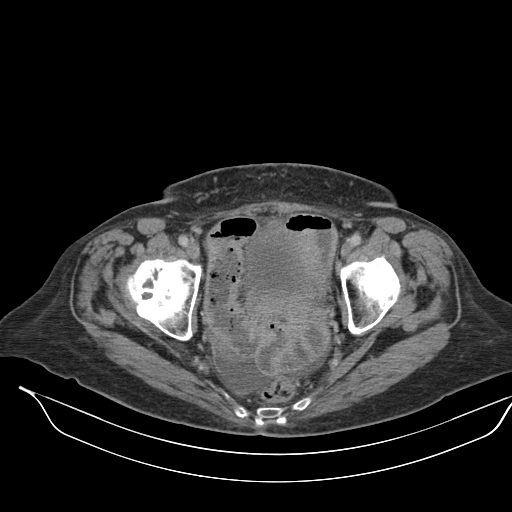
[im 28/94  soft-tissue]
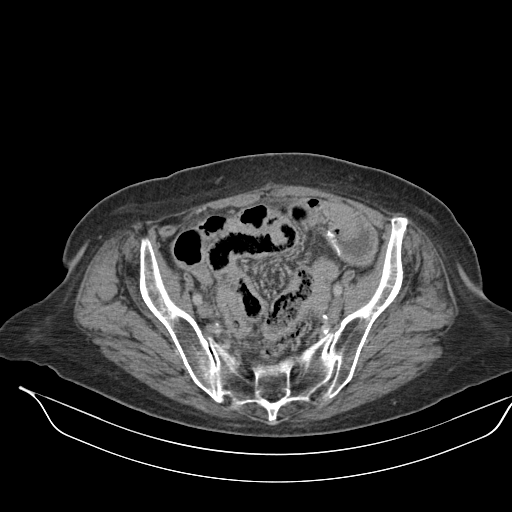
[im 33/94  soft-tissue]
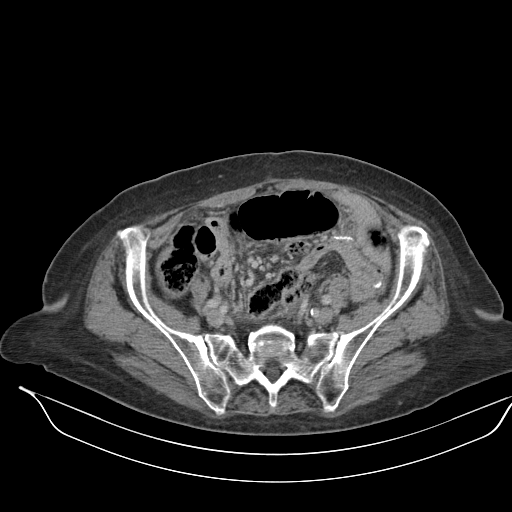
[im 42/94  soft-tissue]
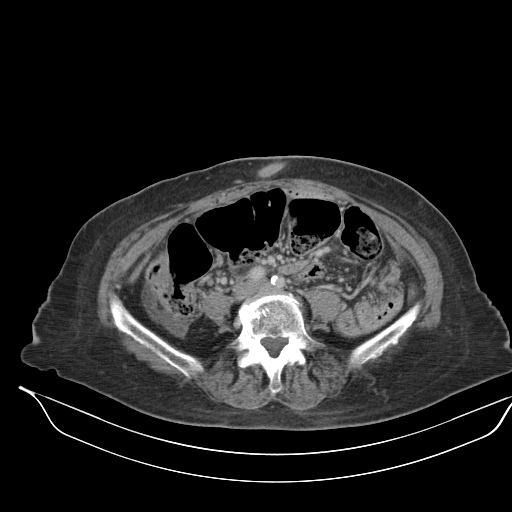
[im 47/94  soft-tissue]
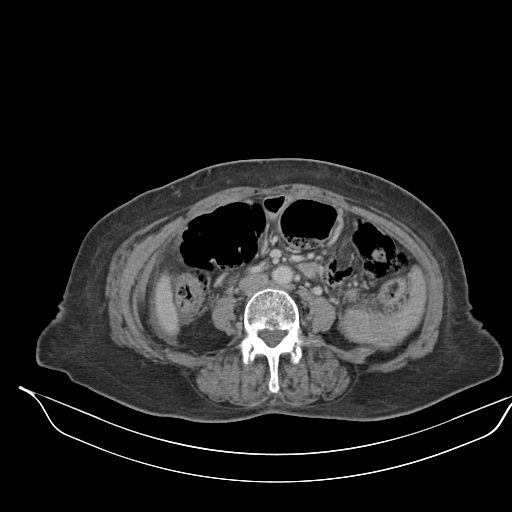
[im 52/94  soft-tissue]
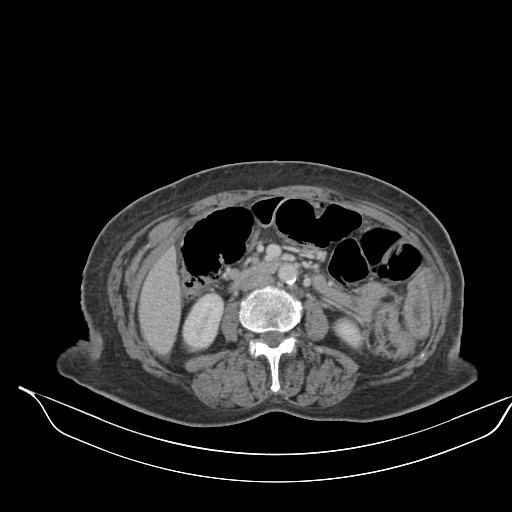
[im 61/94  soft-tissue]
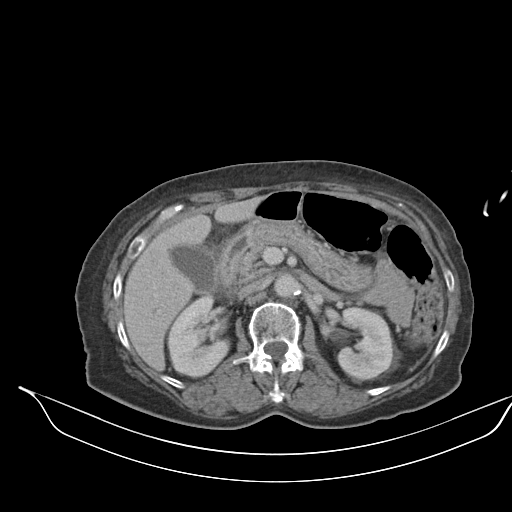
[im 61/94  bone]
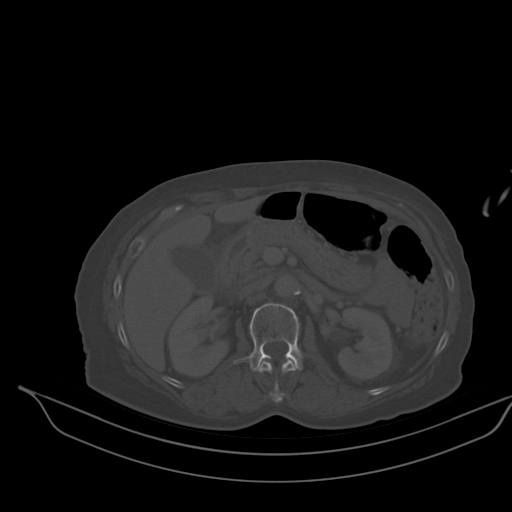
[im 66/94  soft-tissue]
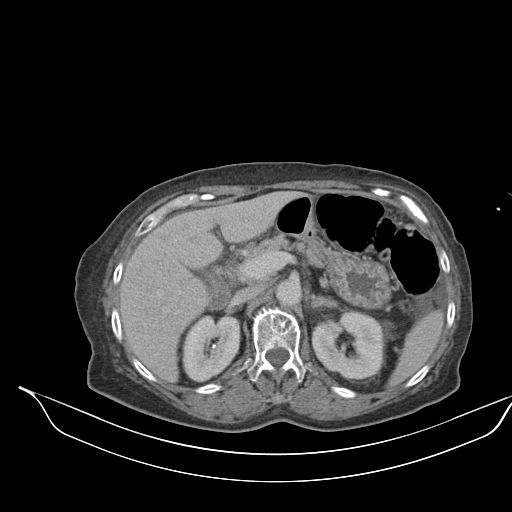
[im 75/94  soft-tissue]
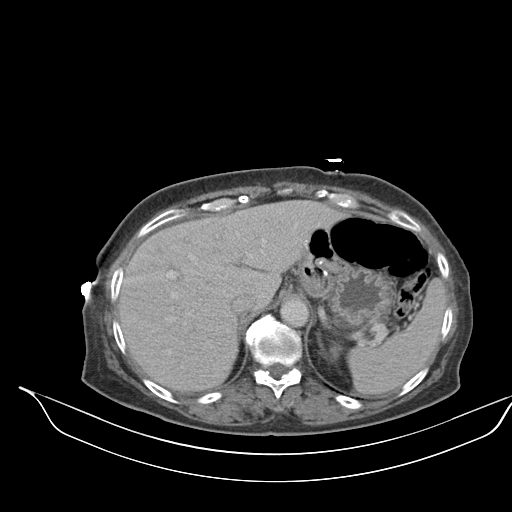
[im 75/94  lung]
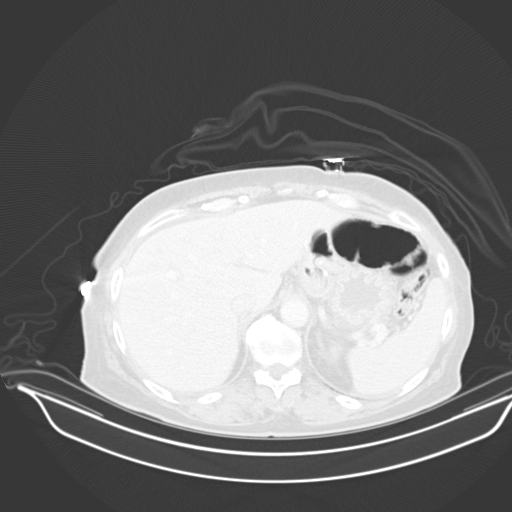
[im 80/94  soft-tissue]
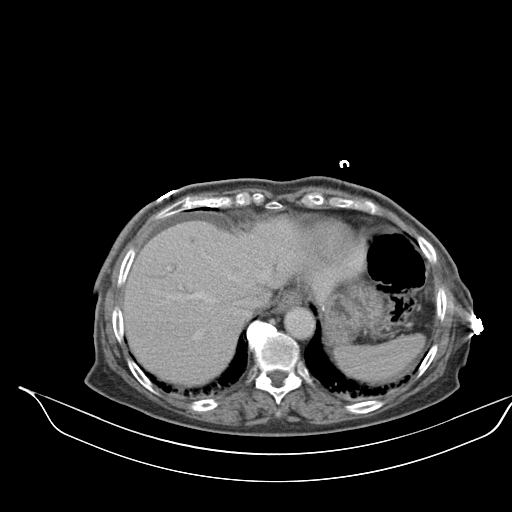
[im 80/94  lung]
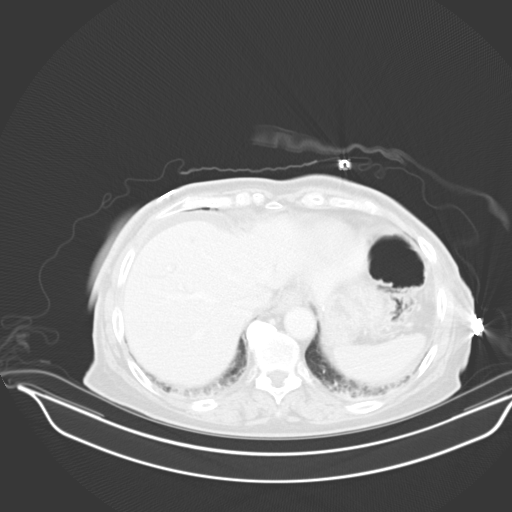
[im 84/94  lung]
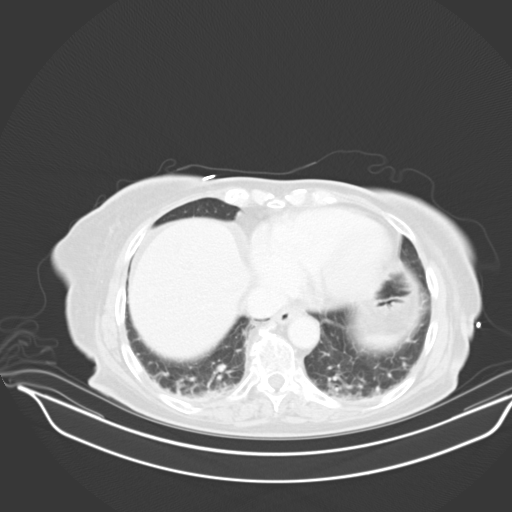
[im 89/94  soft-tissue]
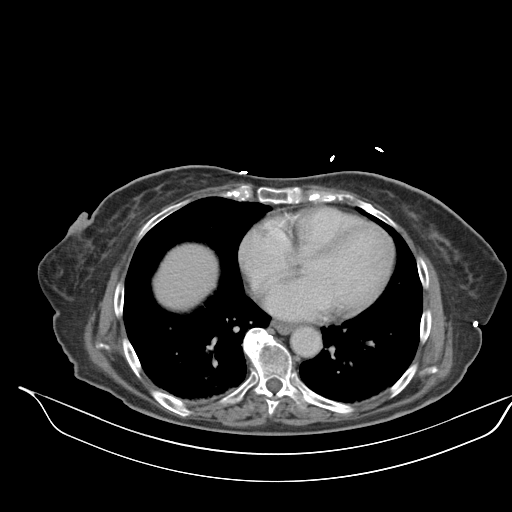
[im 89/94  lung]
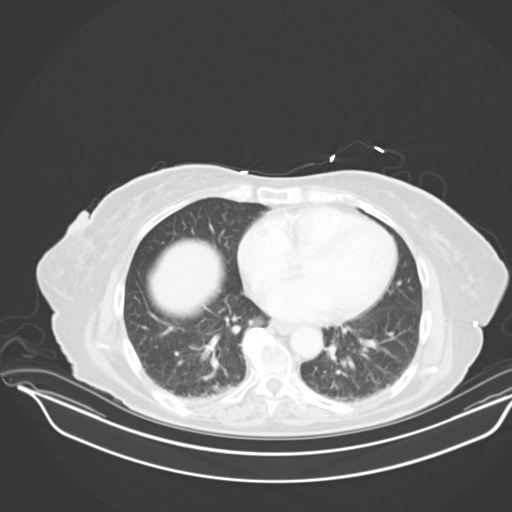

[14 of 32 positions shown; findings below may reference images not displayed]

FINDINGS: The lung bases show bibasilar atelectasis. Surgical changes are seen, reflecting gastric
bypass. The gallbladder is mildly distended with mild intrahepatic biliary dilation. The liver,
spleen, pancreas, and adrenals are within normal limits. No mesenteric or retroperitoneal
adenopathy is seen. The right kidney shows no hydronephrosis or perinephric fat stranding.
The left kidney shows no hydronephrosis or perinephric fat stranding. Bilateral small
nonobstructing renal calculi are seen. The aorta is normal in course and caliber. Mildly
prominent air and fluid-filled small and large bowel loops are seen in the mid and lower
abdomen with the small bowel loops measuring up to 3 cm in diameter. The large bowel
loops measure up to 6.5 cm in diameter. No pneumatosis is seen. Surgical changes are
seen with reflecting appendectomy. There is no free air seen in the abdomen. Small
amount of free fluid is seen of the paracolic gutters bilaterally extending into the pelvis. The
contrasted urinary bladder is unremarkable. Degenerative changes are seen of the lumbar
spine with vacuum disc throughout the lower levels.
IMPRESSION: 1. There is no hydronephrosis or perinephric fat stranding seen.
2. Nonobstructive bowel gas pattern without free air. There are air and fluid-filled loops of
large and small bowel seen throughout the abdomen suggesting adynamic ileus.
Follow-up recommended.
3. Small amount of free fluid is seen in the paracolic gutters and pelvis, likely related to #2
above, and #4 below.
4. Mildly hydropic gallbladder is seen with enlargement and mild intrahepatic biliary dilation seen.

Tech Notes:
OMNIPAQUE 300

## 2018-05-22 IMAGING — CT Pelvis^CONFORMIS_NEW (Adult)
1 of 3 series · 10 of 14 positions shown, 12 images · non-contrast
Comparison: none

[Series 3: knee 1.0 b60s · axial · 0.49mm/px · z∈[-639,-476]mm · 10 of 401 slices shown, 12 images]
[im 37/401  soft-tissue]
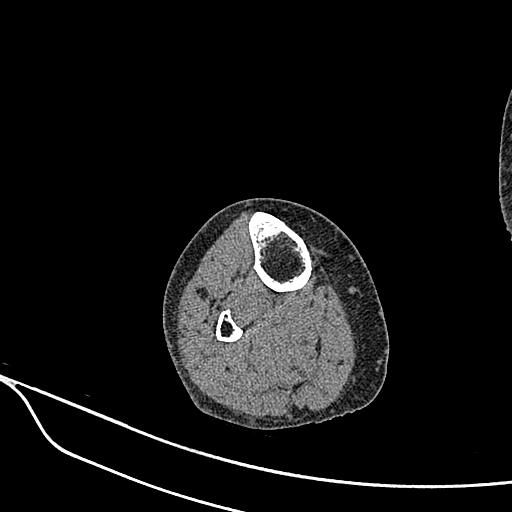
[im 37/401  bone]
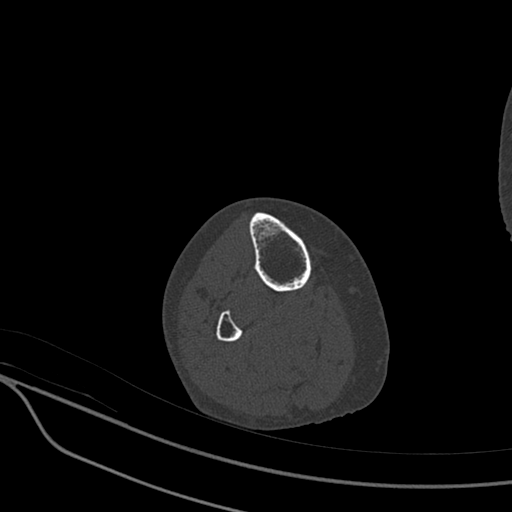
[im 73/401  soft-tissue]
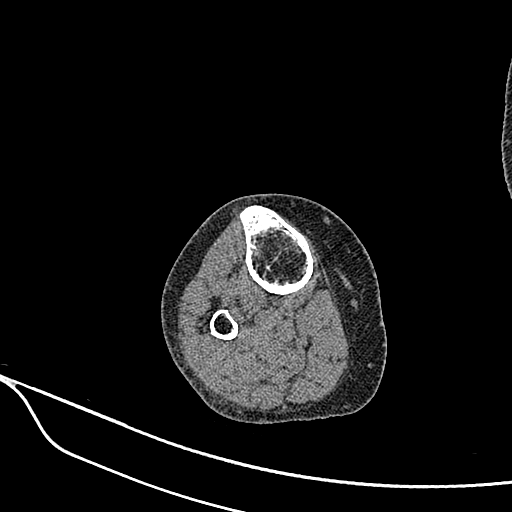
[im 110/401  soft-tissue]
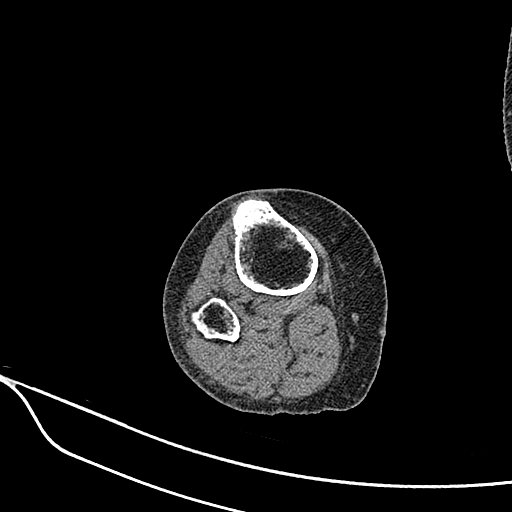
[im 146/401  soft-tissue]
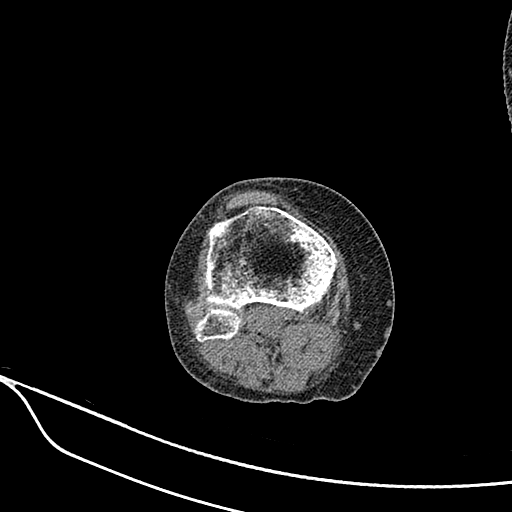
[im 182/401  soft-tissue]
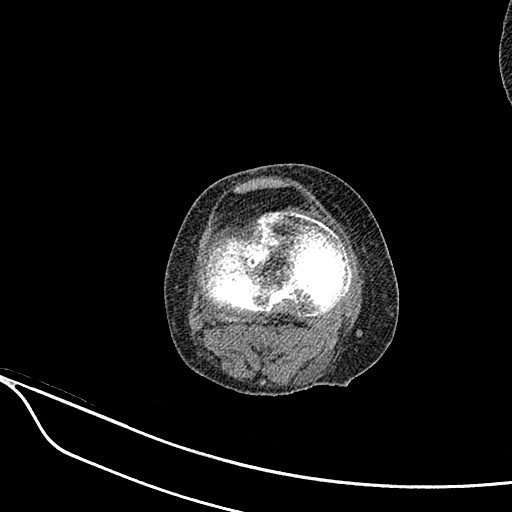
[im 219/401  soft-tissue]
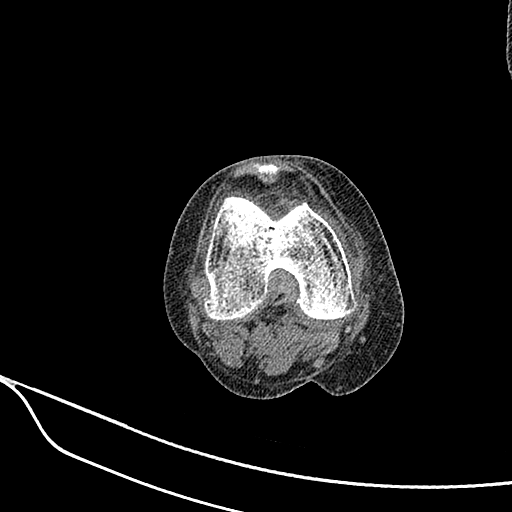
[im 255/401  soft-tissue]
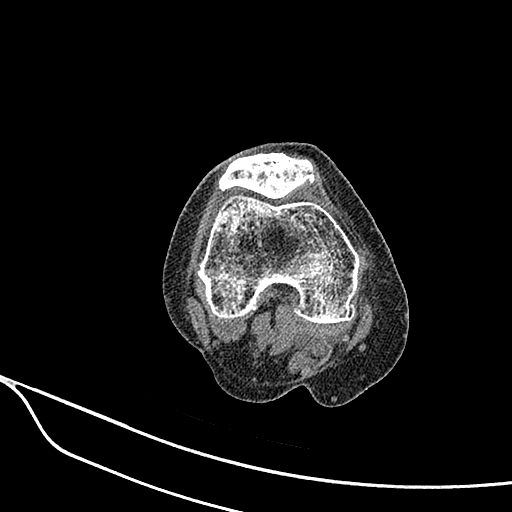
[im 291/401  soft-tissue]
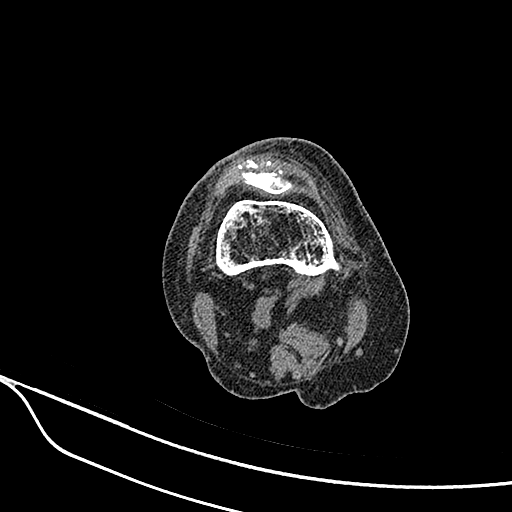
[im 328/401  soft-tissue]
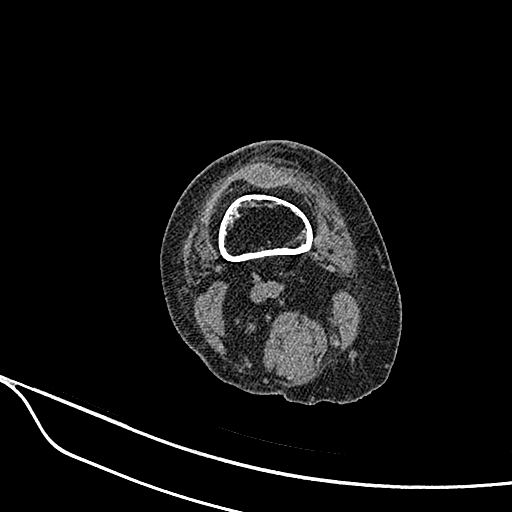
[im 328/401  bone]
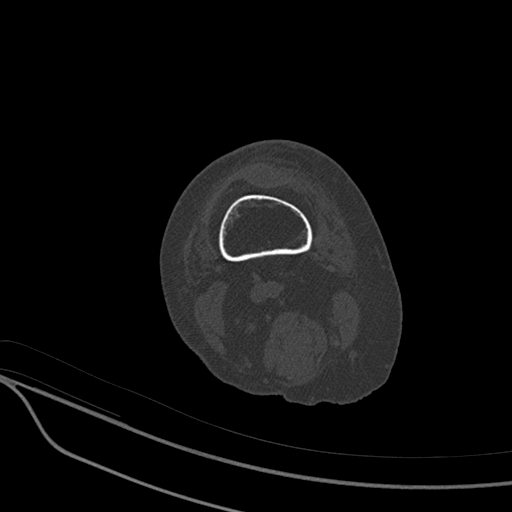
[im 364/401  soft-tissue]
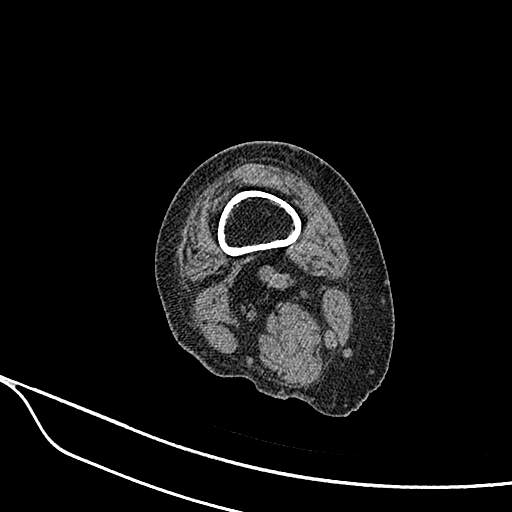

[10 of 14 positions shown; findings below may reference images not displayed]

DIAGNOSTIC STUDIES

EXAM
CT scan of the right knee.

INDICATION
planned knee replacement
CONFORMIS. RIGHT KNEE SURGERY PLANNED. TJ

TECHNIQUE
Multiple, contiguous transaxial images were obtained through the right hip, right knee and right
ankle utilizing a multidetector CT scanner. Sagittal and coronal reformatted images of the right
knee were generated and evaluated as well.
All CT scans at this facility use dose modulation, iterative reconstruction, and/or weight based
dosing when appropriate to reduce radiation dose to as low as reasonably achievable.

COMPARISONS
Plain films of the right knee dated 07/16/2017.

FINDINGS
End-stage degenerative joint disease of the right hip is noted with subchondral sclerosis and
subchondral cyst formation. There is complete obliteration of the right hip joint space. The right
obturator ring appears intact.
There is evidence of tricompartmental degenerative joint disease of the right knee. Osteophytic
spurring is noted. There is narrowing of the medial compartment. Enthesopathy is identified at the
insertion of the quadriceps tendon into the superior pole of the patella.
Axial images through the right ankle are within normal limits. There is no evidence of significant
joint space narrowing.

IMPRESSION
End-stage degenerative joint disease of the right hip. Tricompartmental degenerative joint disease
of the right knee with significant joint space narrowing along the medial compartment.

Tech Notes:

CONFORMIS. RIGHT KNEE SURGERY PLANNED. TJ

## 2018-07-09 IMAGING — CR LOW_EXM
2 series · 2 of 2 positions shown · non-contrast
Comparison: none

[knee ap]
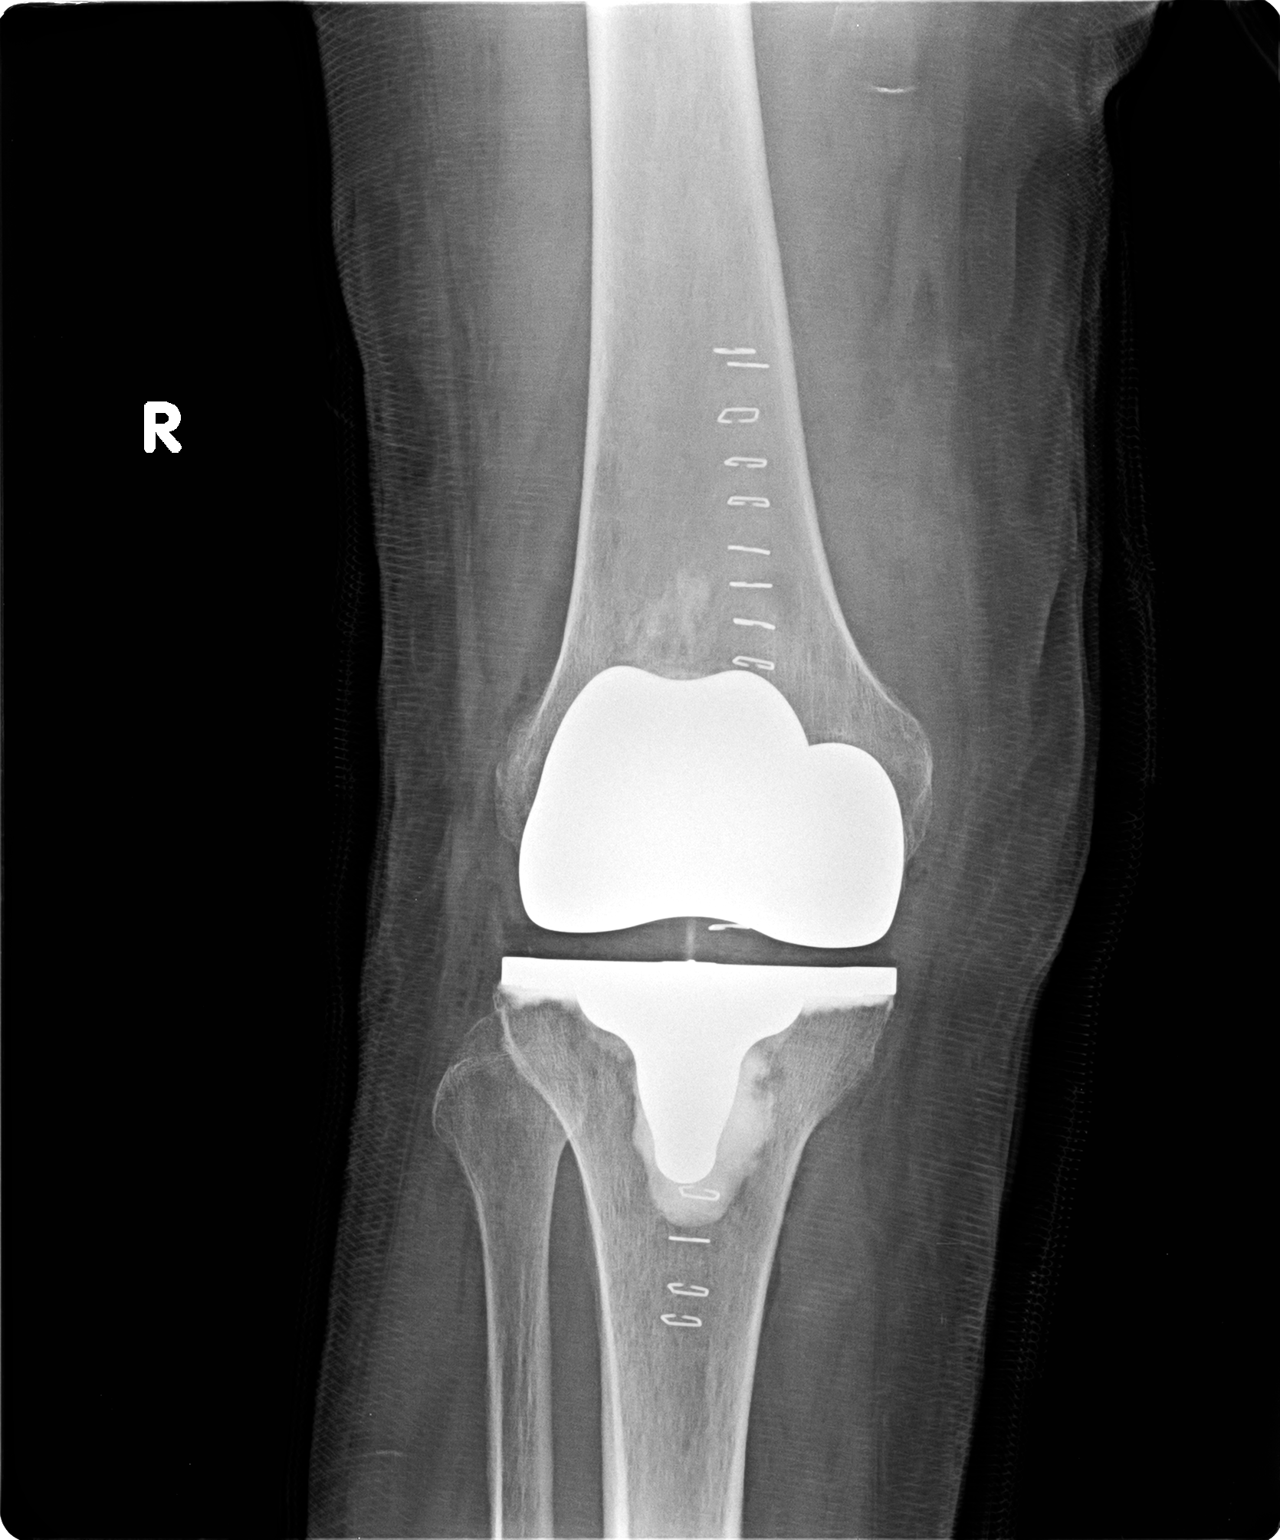

[knee lat]
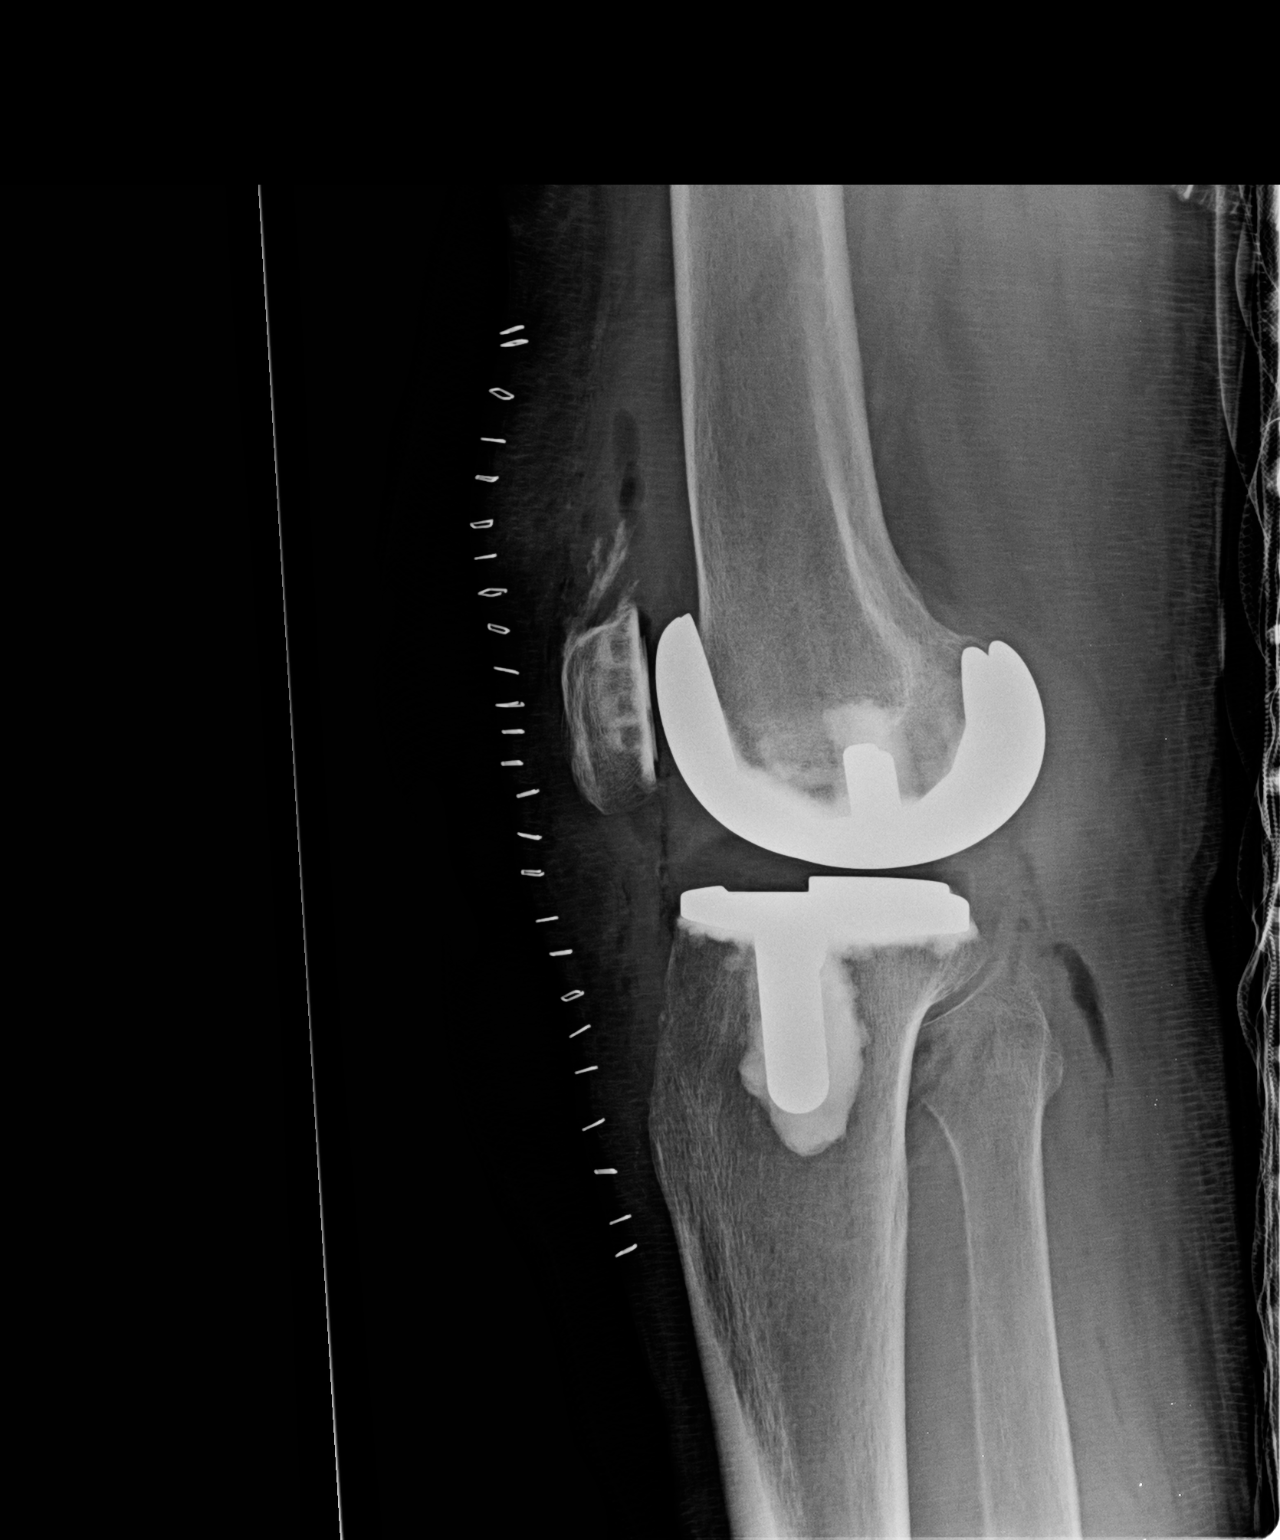

[2 of 2 positions shown; findings below may reference images not displayed]

EXAM

Right knee.

INDICATION

Total knee prosthesis.

FINDINGS

Two-views demonstrate a well-positioned total right knee prosthesis. The components are intact and
well aligned.

There is no fracture or dislocation.

IMPRESSION

Well positioned and aligned total right knee prosthesis.

Tech Notes:

POST OP TKA. ME

## 2018-11-06 IMAGING — CR LOW_EXM
3 series · 3 of 3 positions shown · non-contrast
Comparison: none

[knee ap]
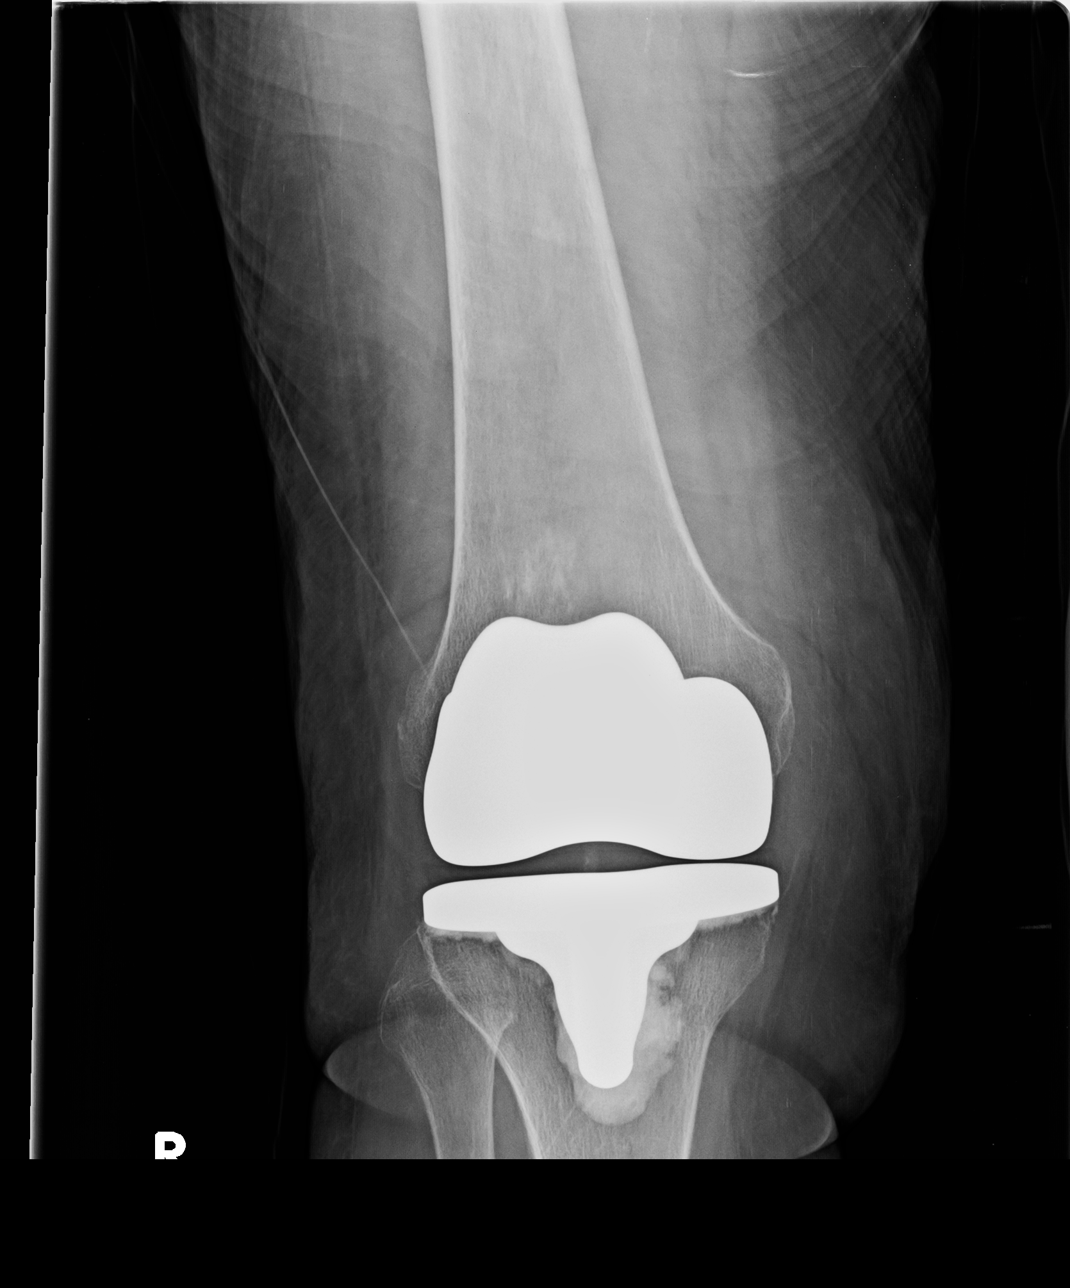

[knee sunrise]
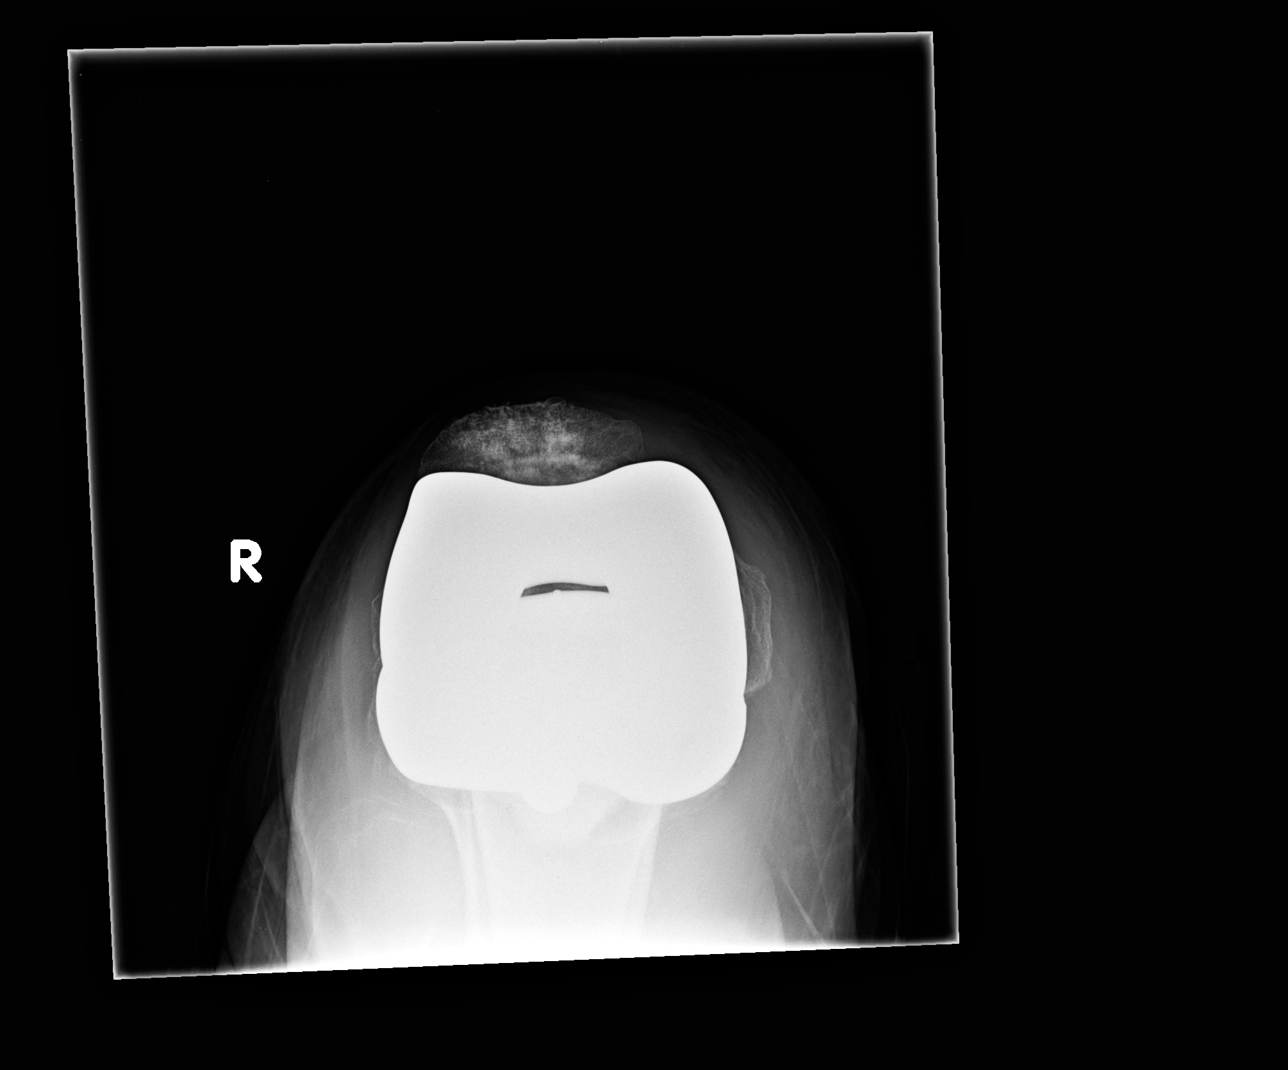

[knee lat]
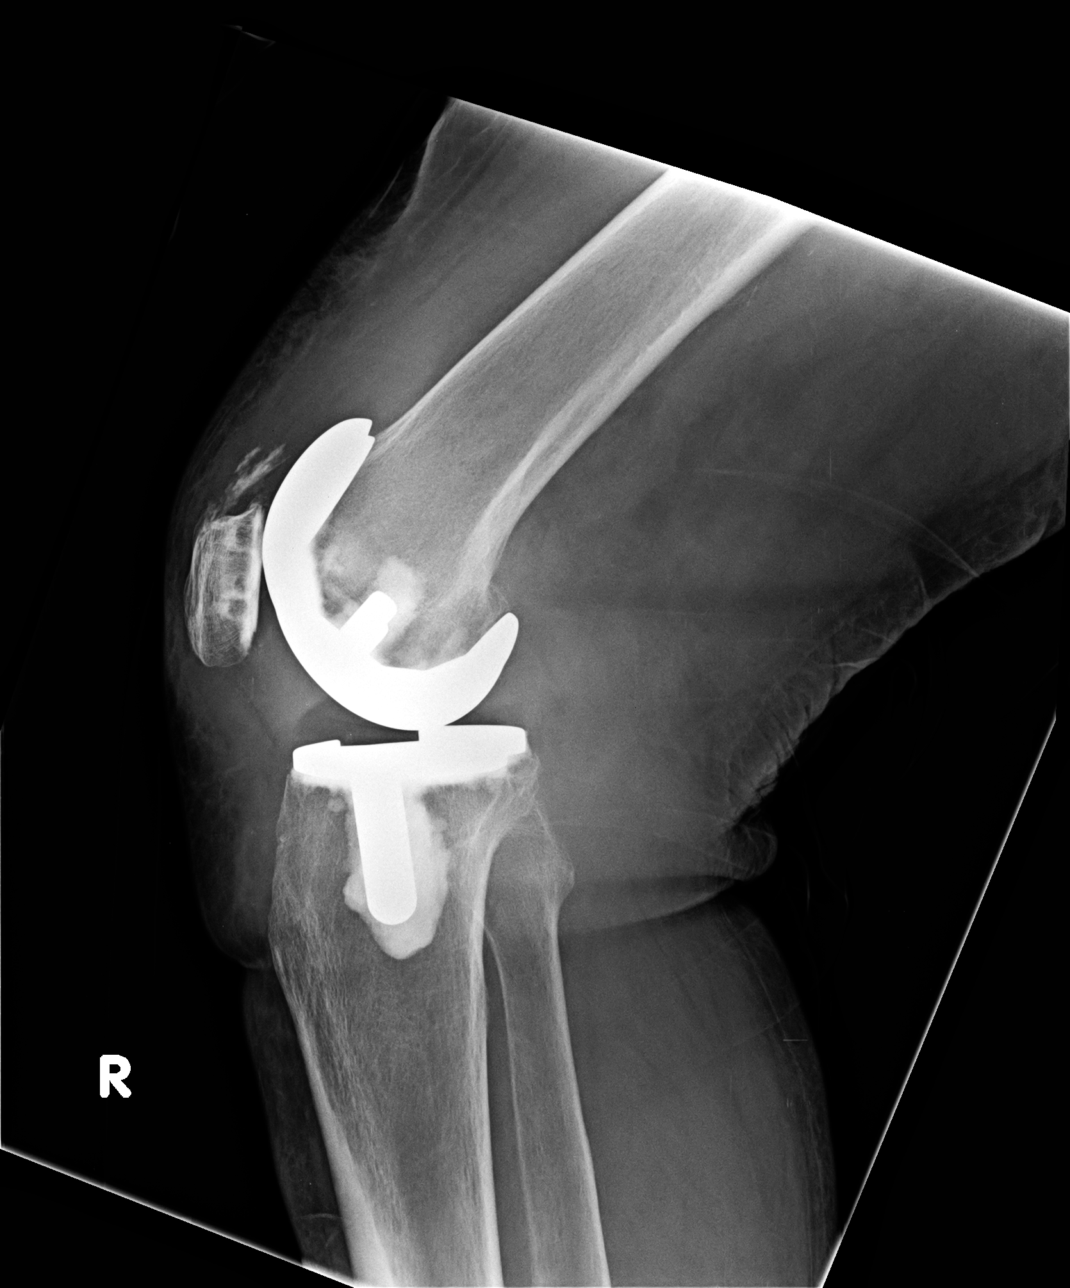

[3 of 3 positions shown; findings below may reference images not displayed]

EXAM

3 views  right knee

INDICATION

right knee pain
RIGHT KNEE PAIN. H/O TKA. ME

TECHNIQUE

AP, lateral and  sunrise   views were obtained.

COMPARISONS

Two views right knee July 09, 2018

FINDINGS

There is a total knee arthroplasty without evidence of hardware loosening.  There is no acute
fracture, dislocation, or other acute osseus abnormality. There is a small suprapatellar joint
effusion.

IMPRESSION

Total knee arthroplasty without evidence of hardware fracture or failure. Small suprapatellar joint
effusion.

Tech Notes:

RIGHT KNEE PAIN. H/O TKA. ME

## 2018-11-06 IMAGING — CR PELVIS
3 series · 3 of 3 positions shown · non-contrast
Comparison: none

[pelvis]
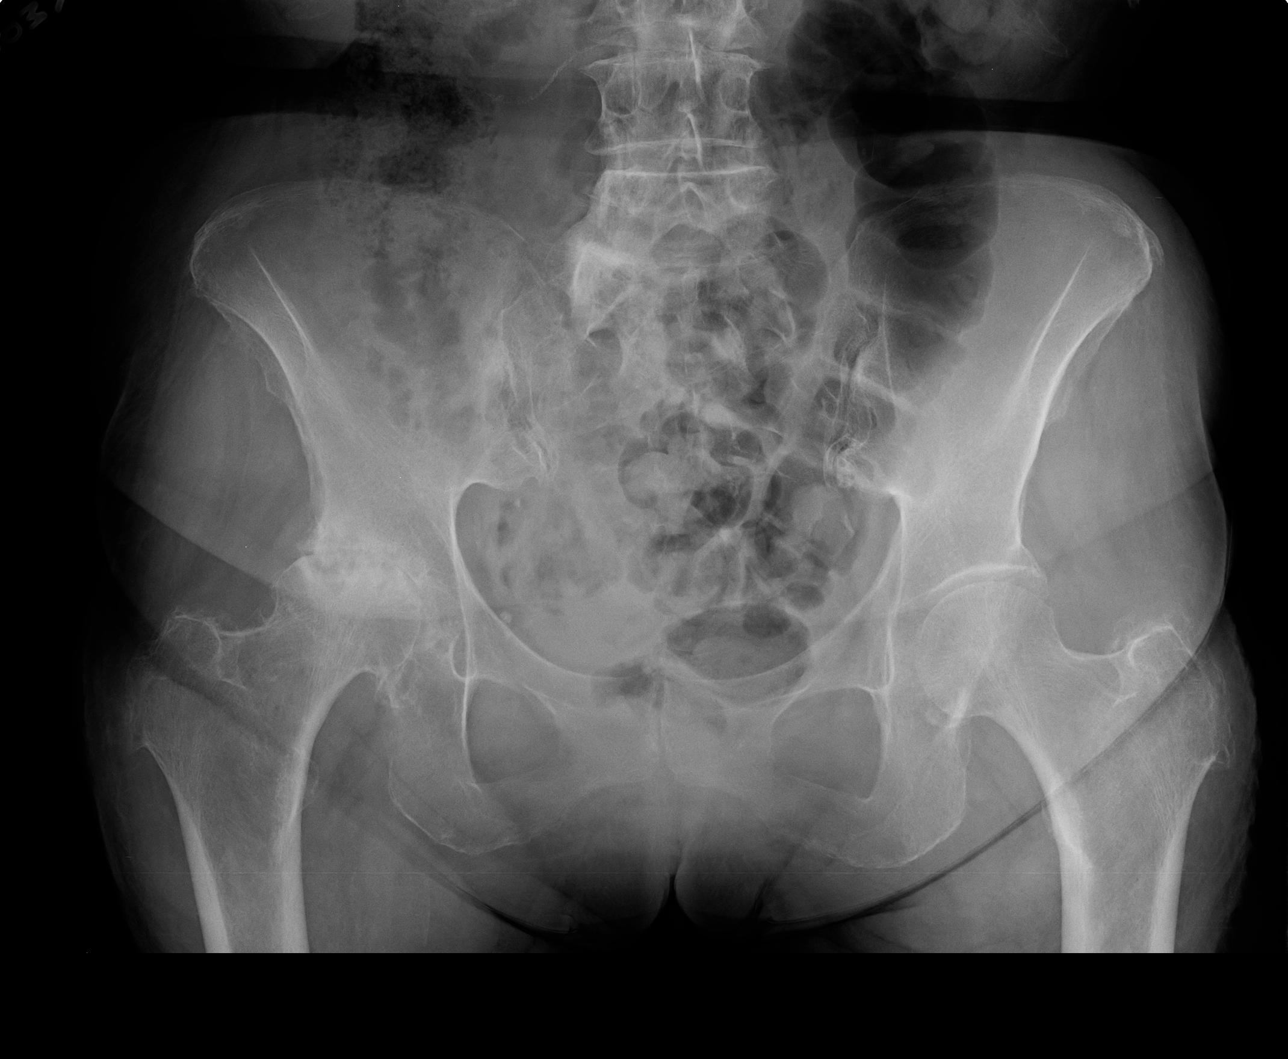

[hip ap]
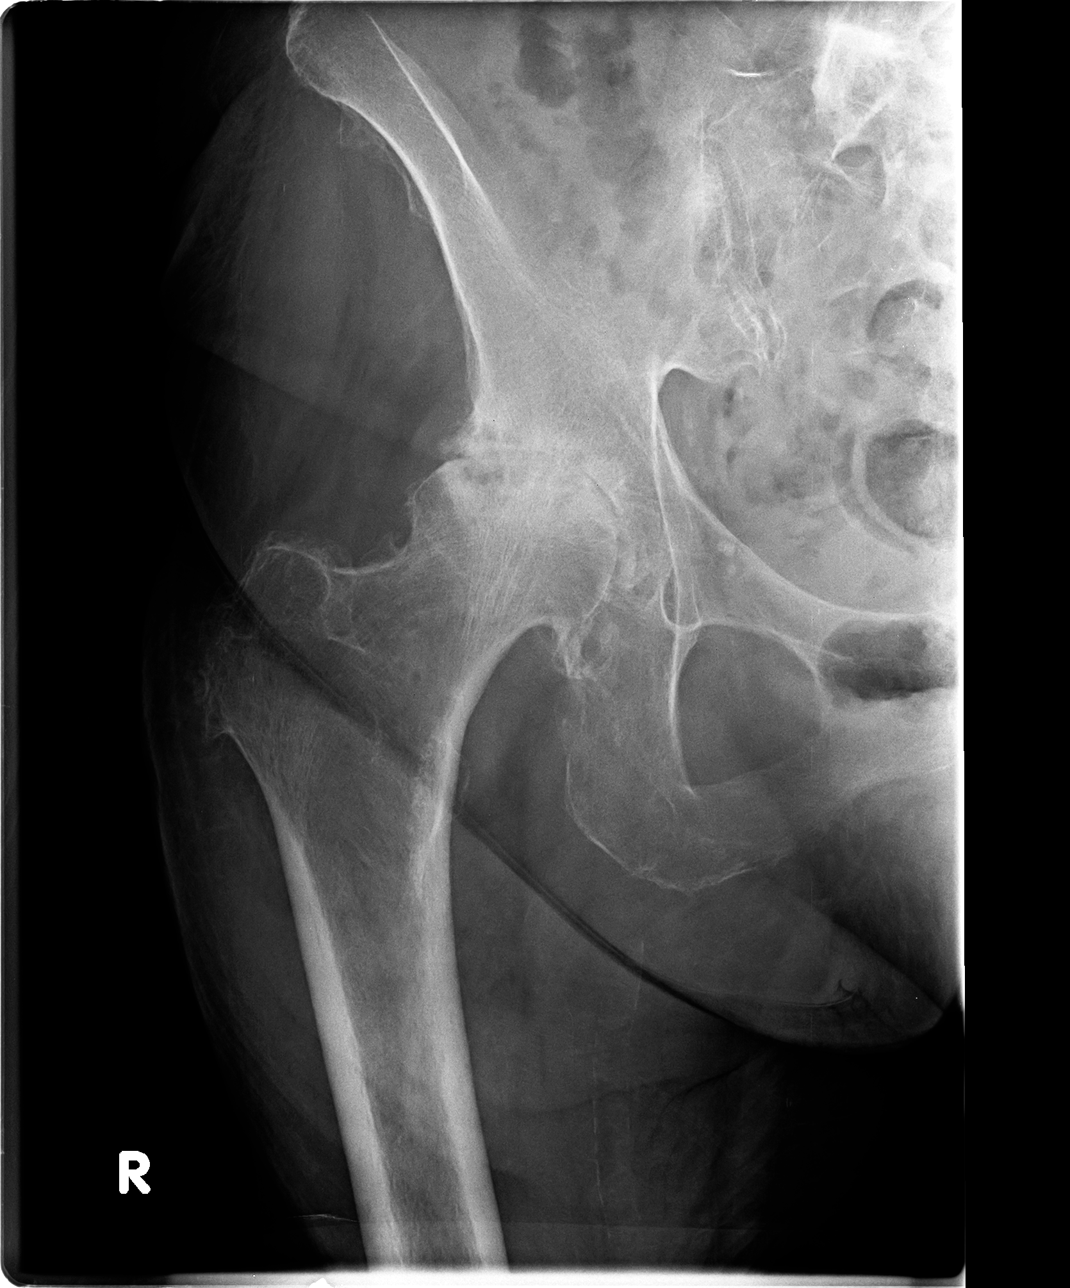

[hip frog]
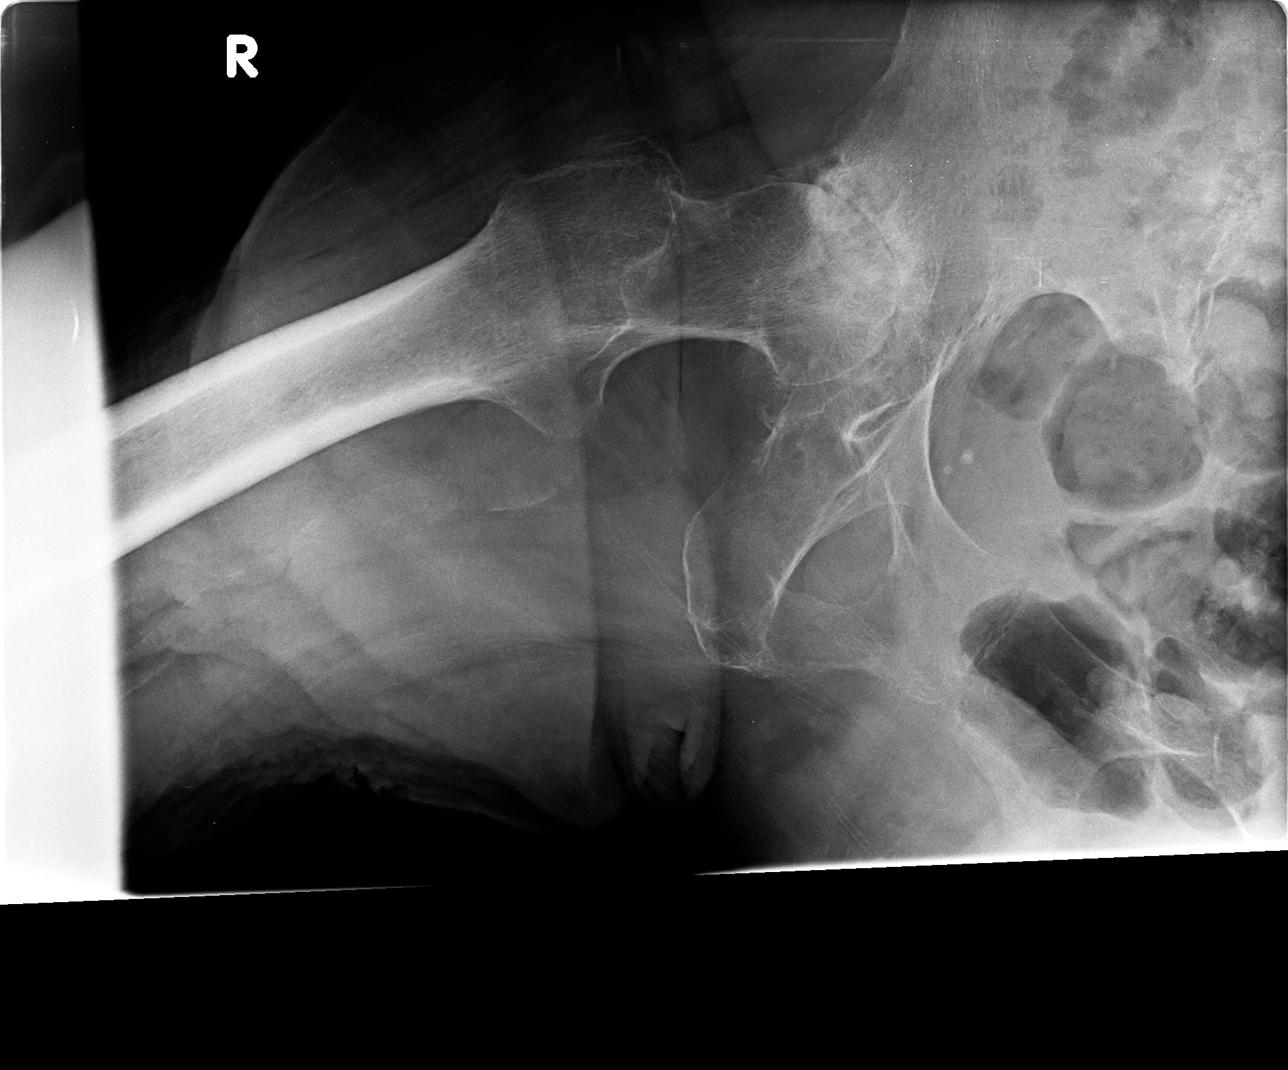

[3 of 3 positions shown; findings below may reference images not displayed]

DIAGNOSTIC STUDIES

EXAM

Two views right hip

INDICATION

RIGHT HIP PAIN
CHRONIC RIGHT HIP PAIN. ME

TECHNIQUE

Single view pelvis   with  AP and Frogleg views   right hip

COMPARISONS

Two views right hip July 16, 2017

FINDINGS

The femoral heads are well seated in the acetabula with severe degenerative change of the right hip
with near complete loss of joint space, subchondral sclerosis and subchondral cystic changes of the
superolateral femoral head. Evolving sequela of osteonecrosis is not excluded. Degenerative changes
of the sacroiliac joints are appreciated.

IMPRESSION

1. Severe degenerative change of the right hip with near complete loss of joint space and deformity
of the femoral head which may represent evolution of osteonecrosis changes. Follow-up with MRI may
be useful for improved characterization.

Tech Notes:

CHRONIC RIGHT HIP PAIN. ME

## 2018-11-25 IMAGING — CR PELVIS
1 series · 1 of 1 positions shown · non-contrast
Comparison: none

[hip ap]
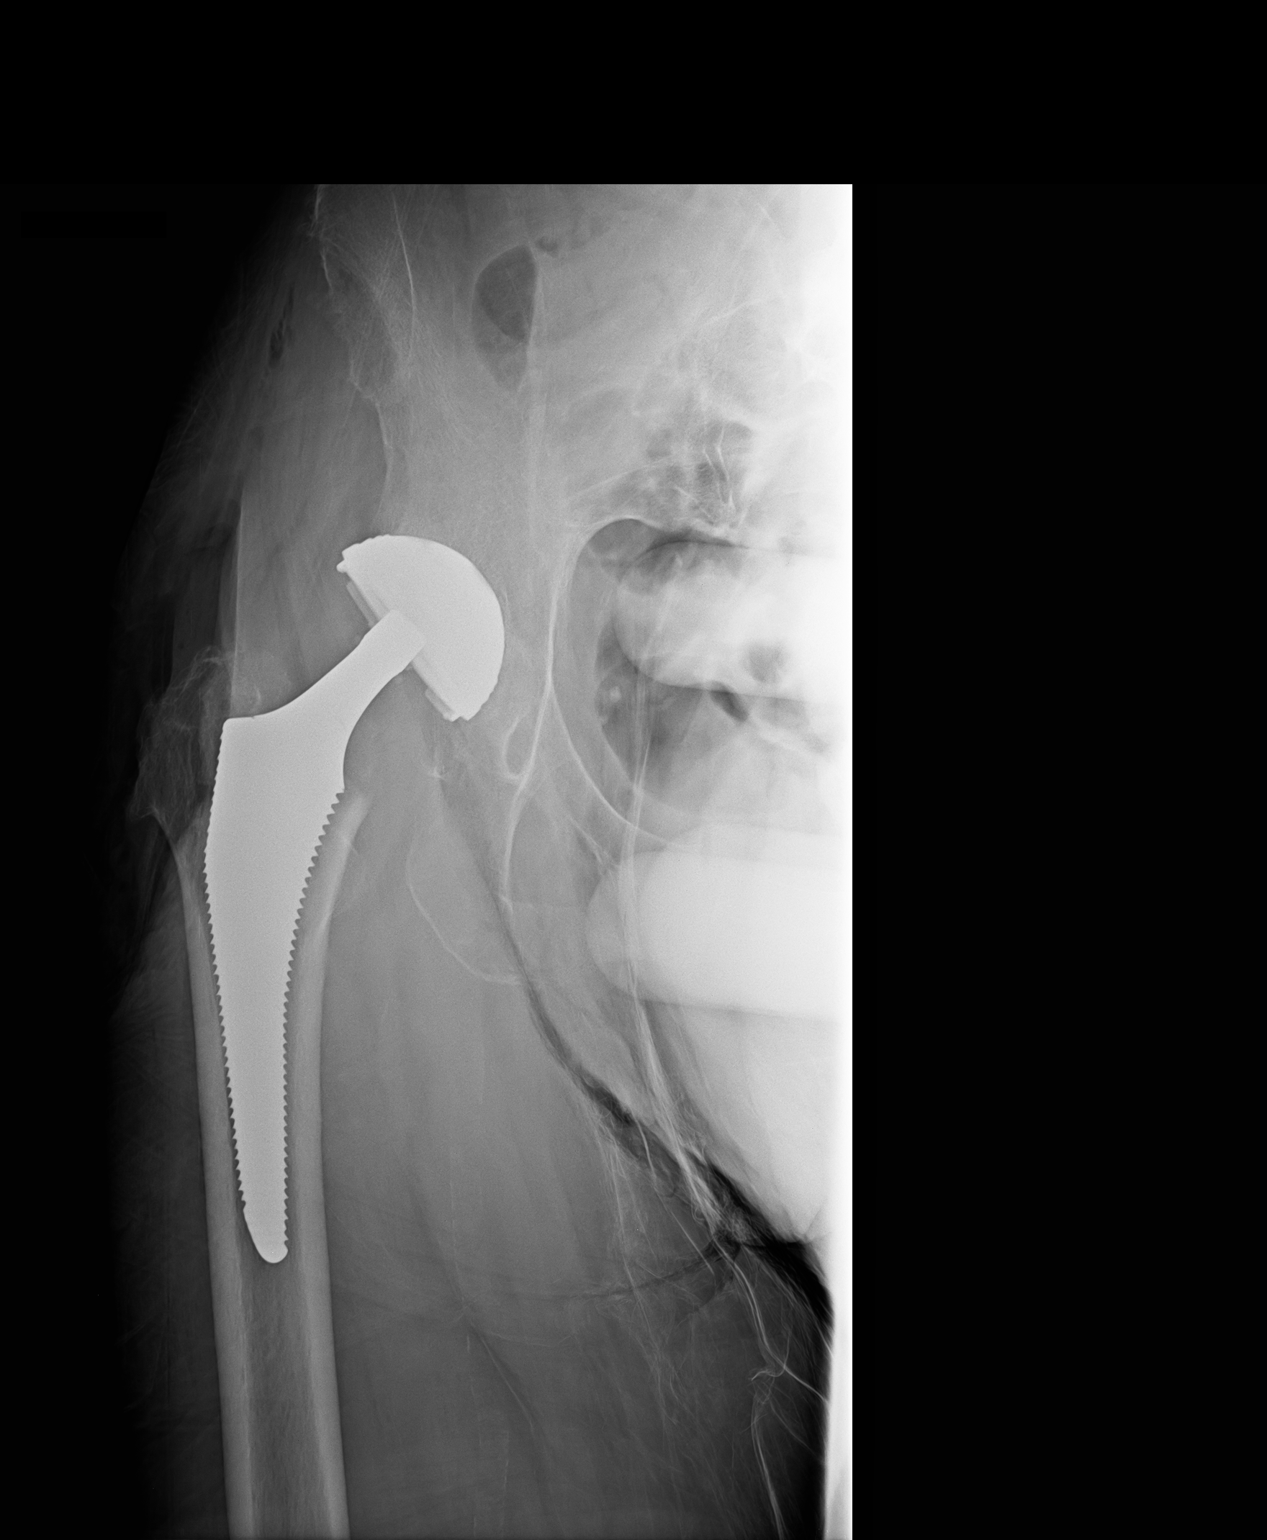

[1 of 1 positions shown; findings below may reference images not displayed]

EXAM

RADIOLOGICAL EXAMINATION, Hip; 1 VIEW CPT 58544

INDICATION

INTRAOPERATIVE RIGHT HIP REPLACEMENT. ME

TECHNIQUE

1 view of the  right hip was acquired.

COMPARISONS

Preoperative evaluation dated 11/06/2018.

FINDINGS

Single intraoperative view of the right hip demonstrates acetabular cup and intramedullary
component of a 2 part right hip prosthesis in place.

IMPRESSION

Status post right hip replacement.

Tech Notes:

INTRAOPERATIVE RIGHT HIP REPLACEMENT. ME

## 2018-11-25 IMAGING — CR PELVIS
1 series · 1 of 1 positions shown · non-contrast
Comparison: none

[hip ap]
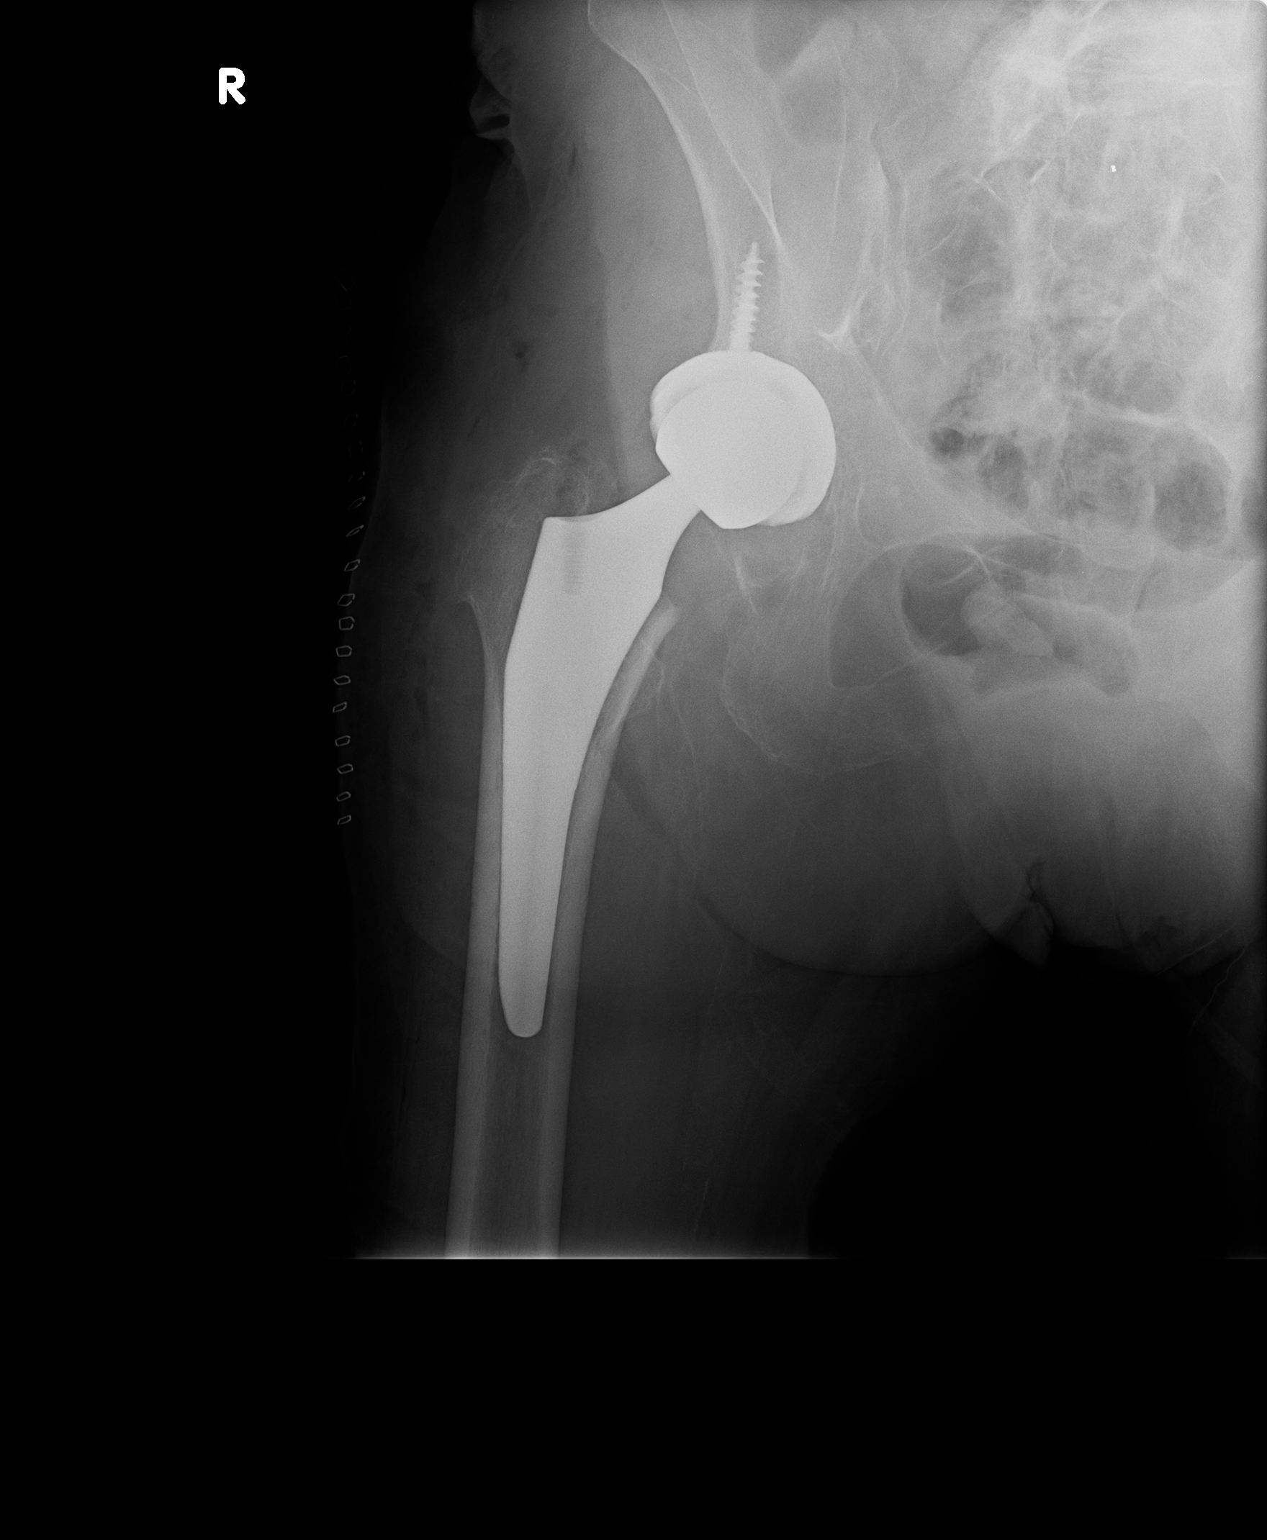

[1 of 1 positions shown; findings below may reference images not displayed]

EXAM

RADIOLOGICAL EXAMINATION, HIP; COMPLETE 2 VIEWS CPT 80801;

INDICATION

Status post right total hip arthroplasty. Postop right hip replacement.

COMPARISONS

Preoperative evaluation dated 11/06/2018.

FINDINGS

Bipolar  right hip prosthesis is identified in place. The orthopedic hardware appears intact. There
are no fractures or subluxations. There are no abnormal masses or calcifications. There are no
blastic or lytic lesions.

IMPRESSION

Status post  right hip replacement. Surgical skin staples are identified

Tech Notes:

post op rt hip

## 2019-10-12 IMAGING — CT STONE PROTOCOL(Adult)
2 of 3 series · 11 of 46 positions shown, 12 images · non-contrast
Comparison: none

[Series 2: abdomen ax 3.00 br40 s3 · axial · 0.48mm/px · z∈[+1434,+1806]mm · 8 of 140 slices shown, 9 images]
[im 9/140  soft-tissue]
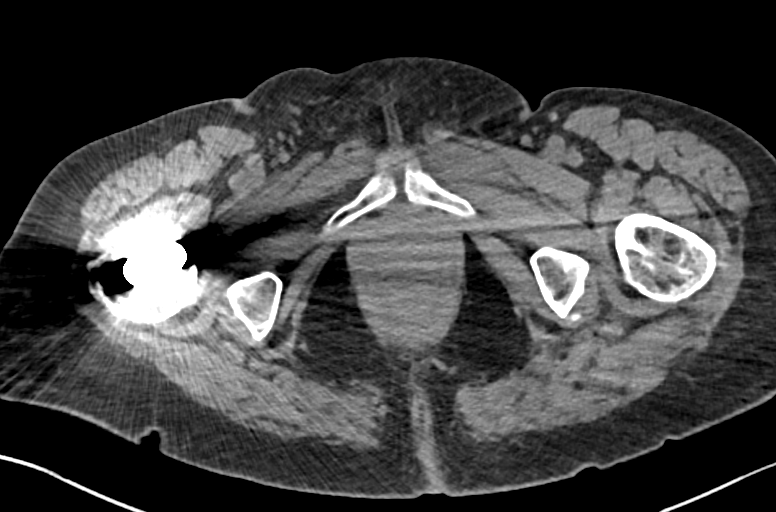
[im 9/140  bone]
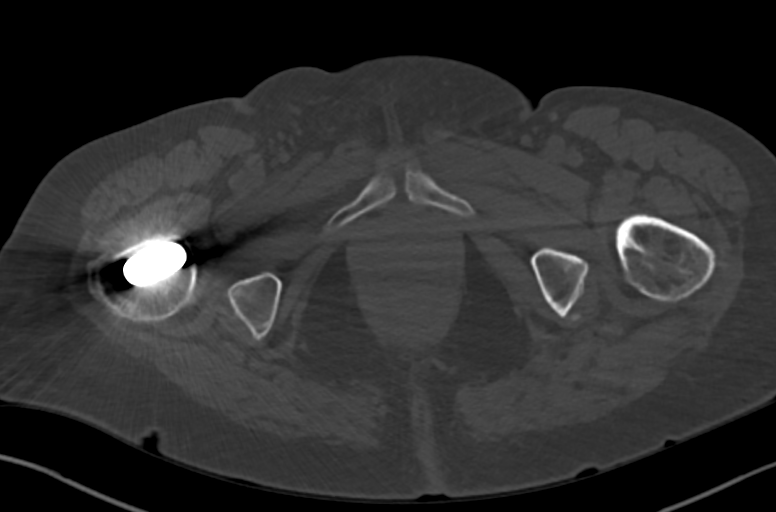
[im 27/140  soft-tissue]
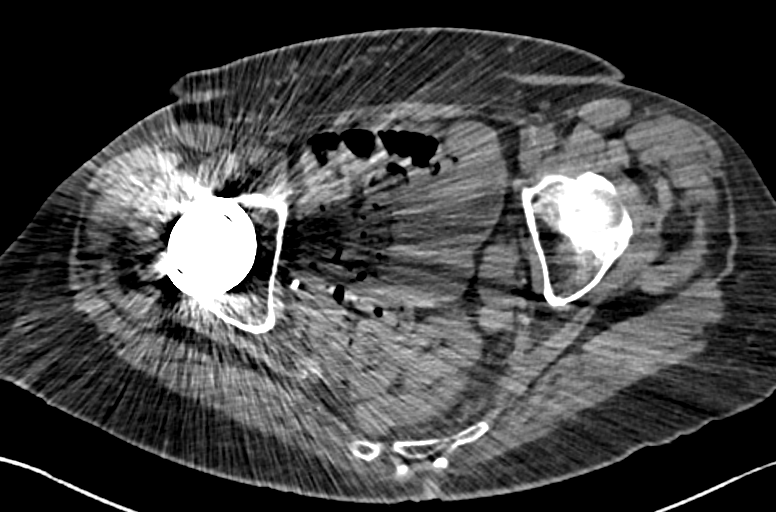
[im 45/140  soft-tissue]
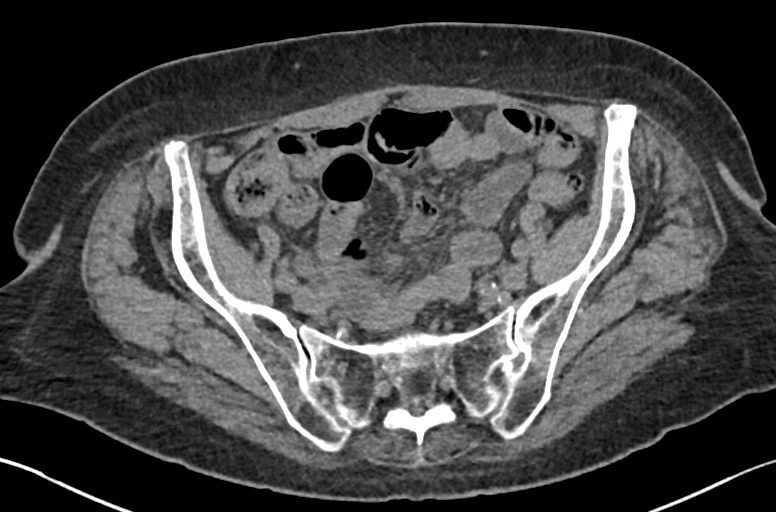
[im 63/140  soft-tissue]
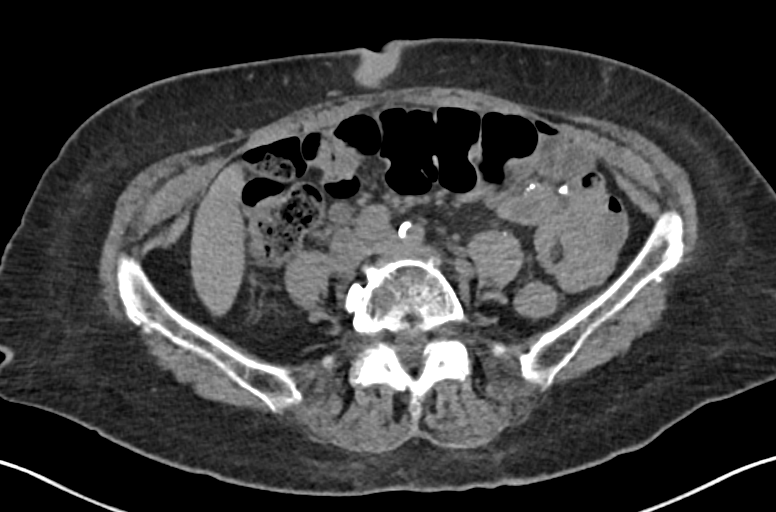
[im 77/140  soft-tissue]
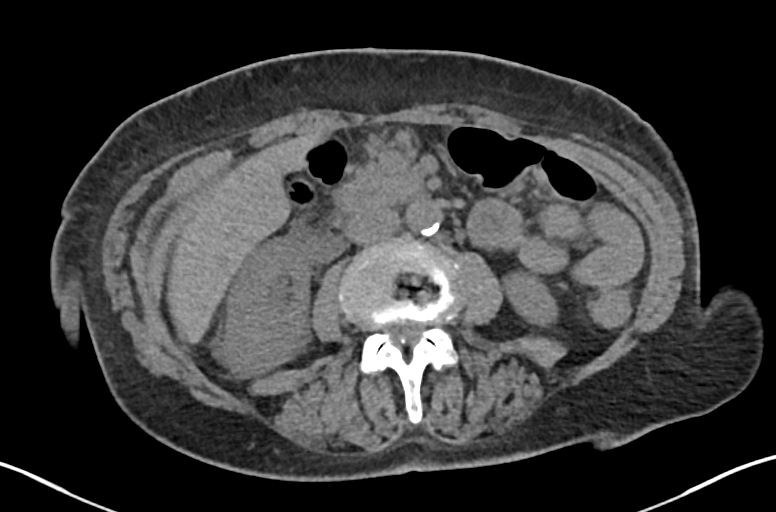
[im 95/140  soft-tissue]
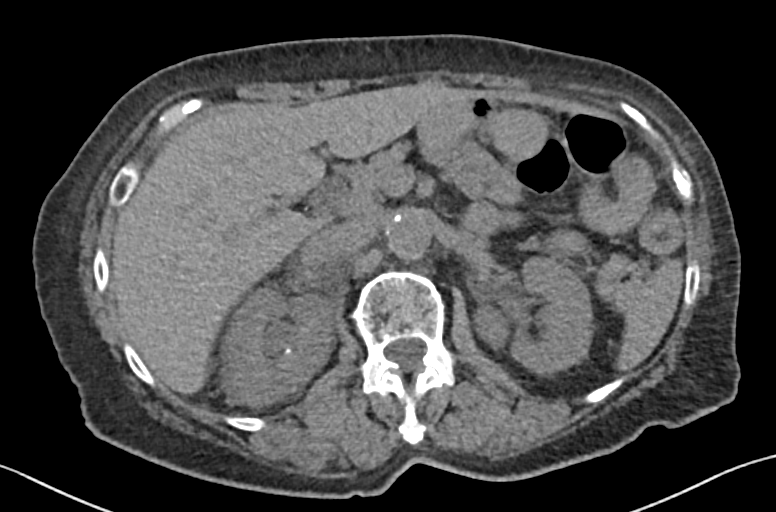
[im 113/140  soft-tissue]
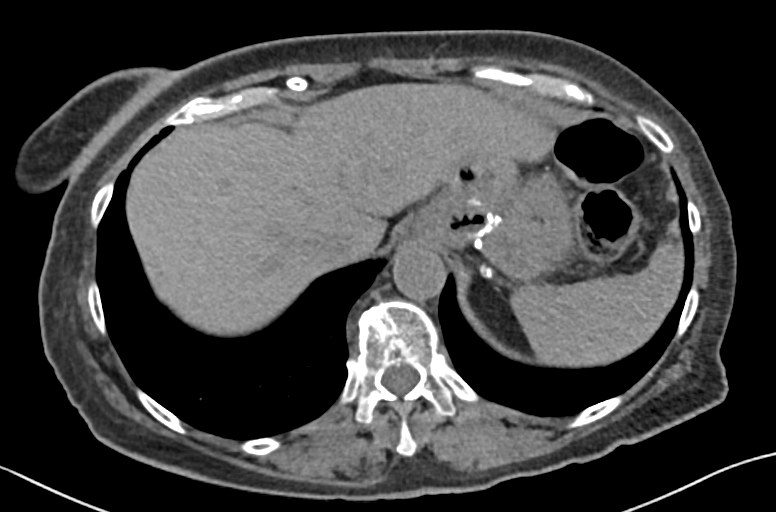
[im 131/140  soft-tissue]
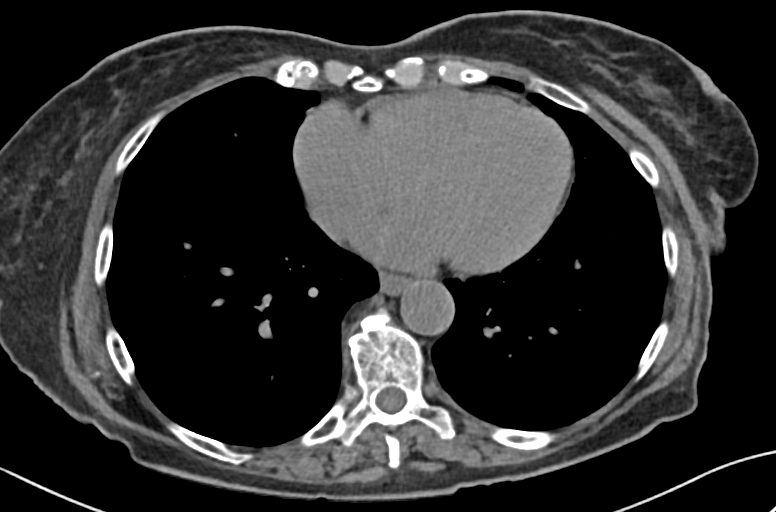

[Series 4: abdomen cor 3.00 br40 s3 · coronal · 0.73mm/px · 3 of 81 slices shown]
[im 27/81  soft-tissue]
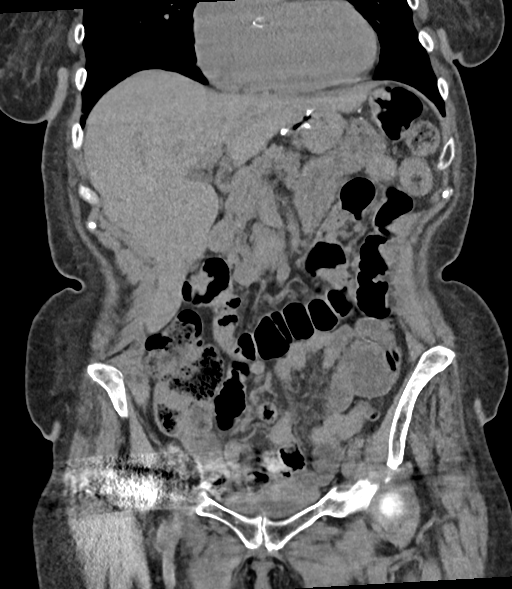
[im 36/81  soft-tissue]
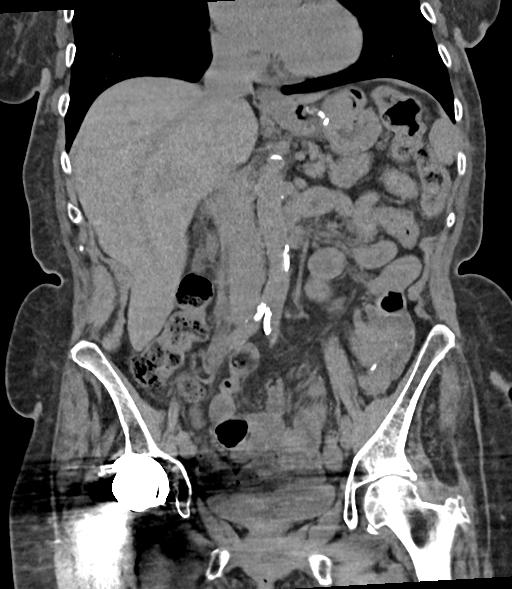
[im 45/81  soft-tissue]
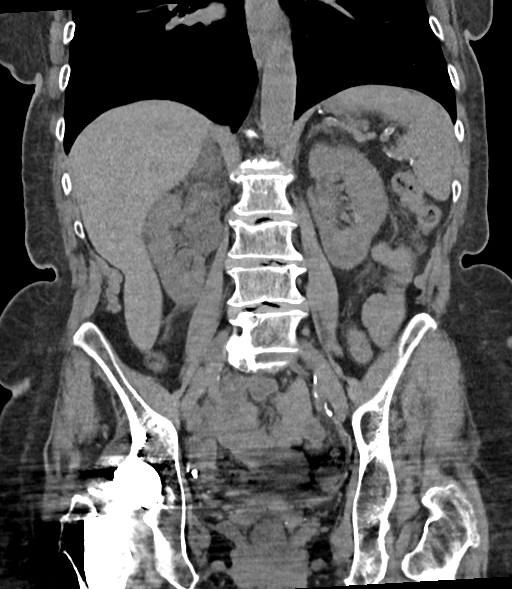

[11 of 46 positions shown; findings below may reference images not displayed]

EXAM

CT abdomen pelvis without contrast.

INDICATION

flank pain
Pt c/o right flank pain. H/o appendectomy, cholecystectomy, gastric bypass, and hysterectomy. CT/NM
0/0. CC

FINDINGS

CT of the abdomen and pelvis was performed without contrast.  The prior study was reviewed from
04/28/2018.

All CT scans at this facility use dose modulation, iterative reconstruction, and/or weight based
dosing when appropriate to reduce radiation dose to as low as reasonably achievable. In the past 12
month, there have been 0 CT scans and 0 nuclear medicine myocardial perfusion scans.

The lung bases are clear.

There is moderately pronounced hydronephrosis and hydroureter on the right.  There are small
nonobstructing intrarenal calculi on the right.  There is mild perinephric stranding on the right.

There appears to be a 5 x 4 millimeter distal right ureteral calculus although it is difficult to
determine with certainty if this is within or adjacent to the distal right ureter. There is limited
visualization of the distal right ureter due to metallic artifact from adjacent hip prosthesis.

There are several nonobstructing intrarenal calculi on the left including a large stone within the
left renal pelvis which is currently nonobstructing but has increased in size since prior study,
measuring 12 x 10 millimeters in diameter.

The liver, spleen, pancreas and adrenals appear normal.  There is no adenopathy.  There is no bowel
obstruction. There has been previous gastric bypass.

There is no acute bone lesion.

IMPRESSION

There has been development of a moderately pronounced right hydronephrosis and hydroureter with
perinephric and periureteral stranding.  This appears be secondary to a 5 x 4 millimeter distal
right ureteral calculus although metallic artifact from hip arthroplasty limits visualization of the
distal right ureter.

There has been increase in size of a left renal calculus which is located in the left renal pelvis
measuring 12 x 10 millimeters in diameter.  This is currently nonobstructing but is most likely
mobile within the left renal pelvis.

Tech Notes:

Pt c/o right flank pain. H/o appendectomy, cholecystectomy, gastric bypass, and hysterectomy. CT/NM
0/0. CC

## 2020-03-16 IMAGING — US RETROPCM
1 series · 13 of 16 positions shown · non-contrast
Comparison: none

[Series 1: us renal/bladder complete · 13 of 45 slices shown]
[im 1/45]
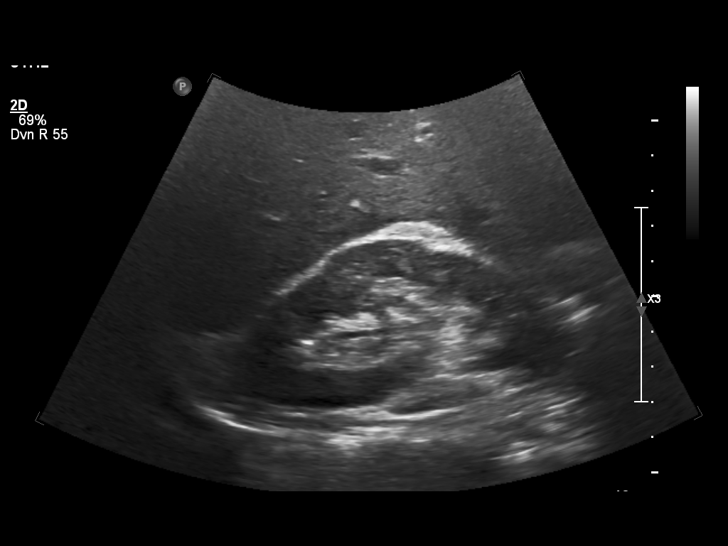
[im 3/45]
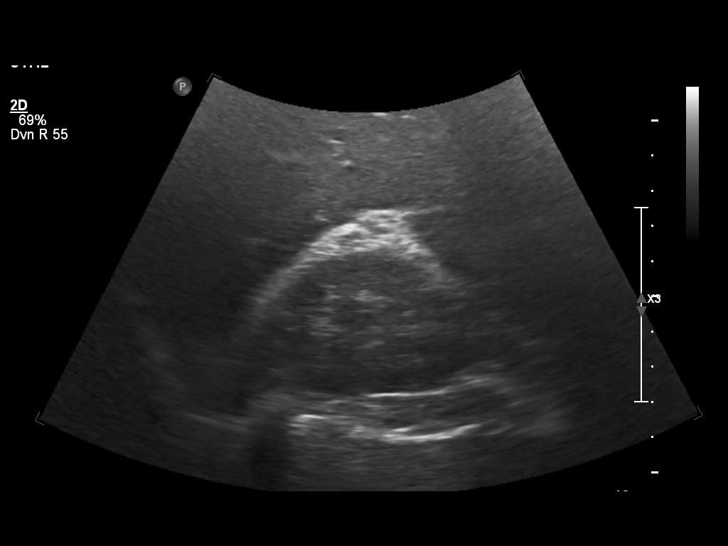
[im 9/45]
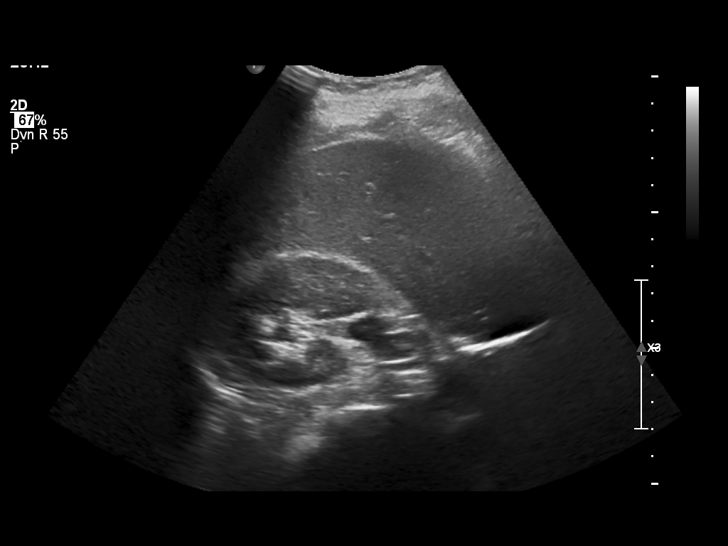
[im 12/45]
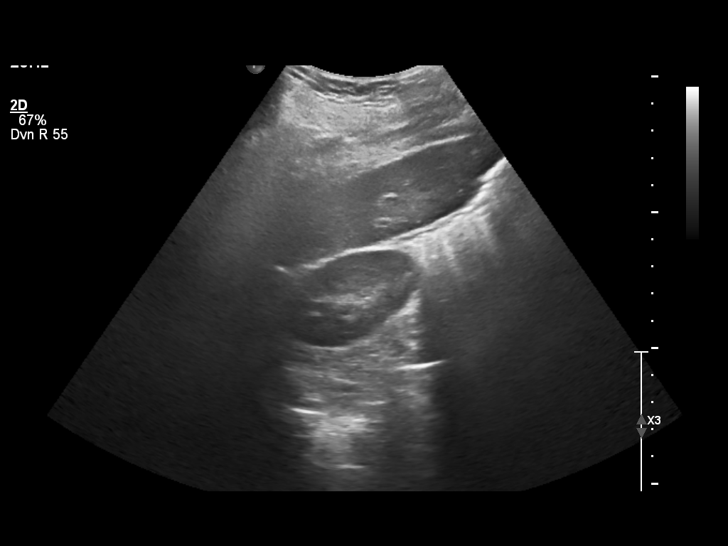
[im 15/45]
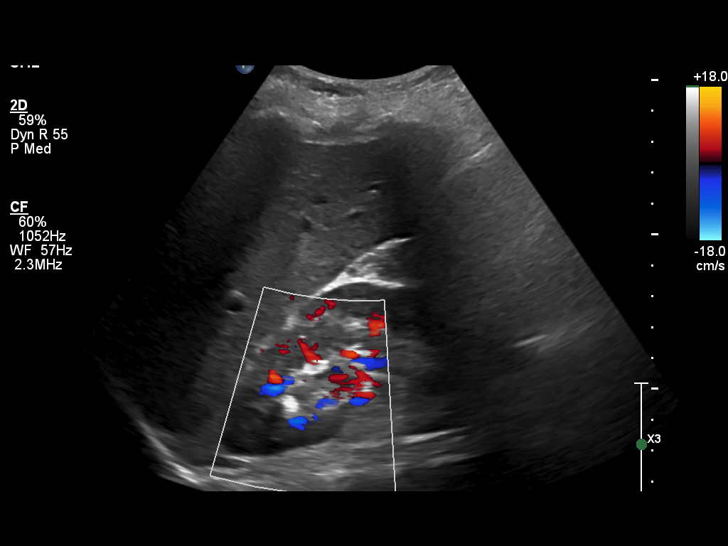
[im 18/45]
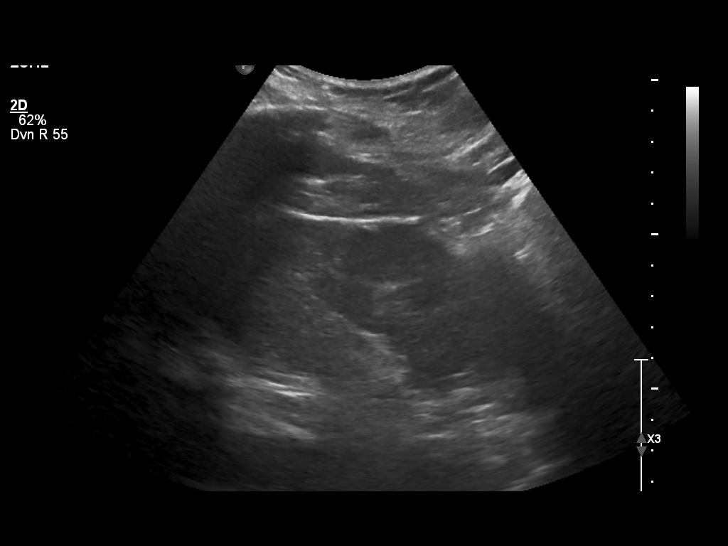
[im 24/45]
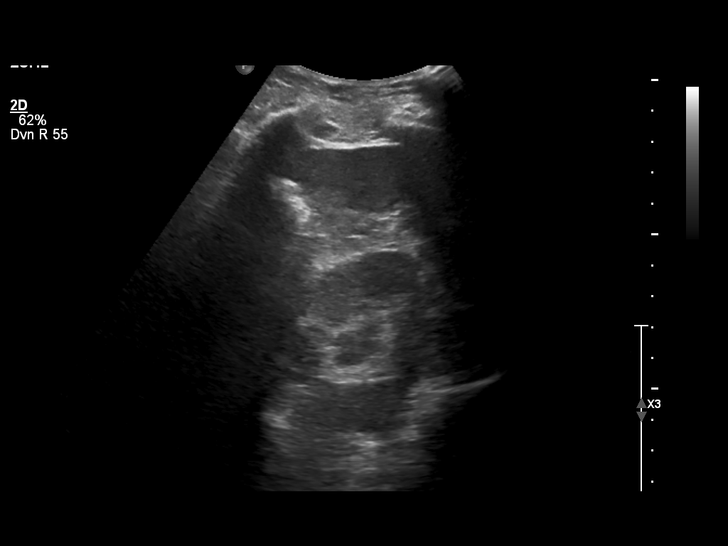
[im 27/45]
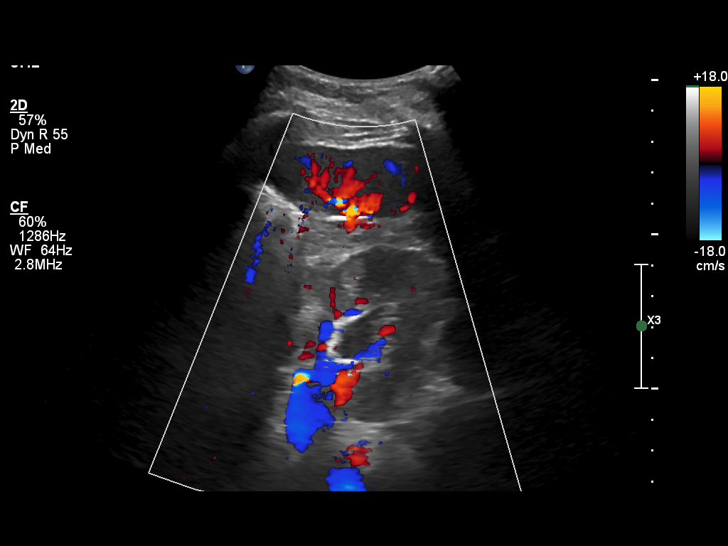
[im 30/45]
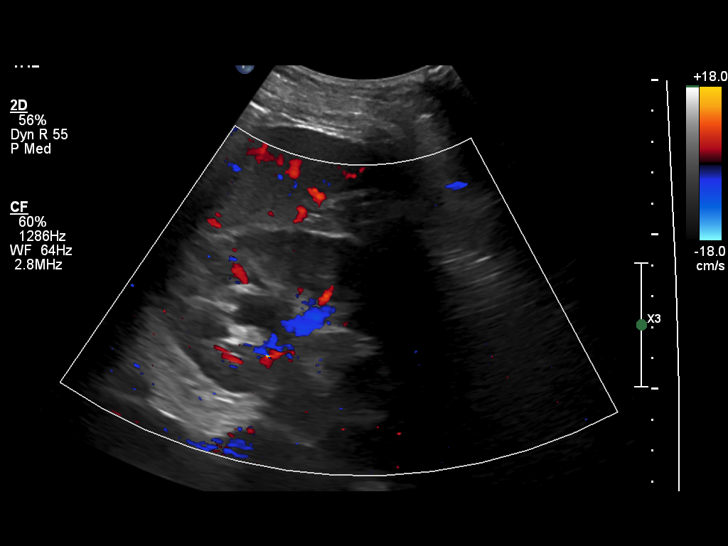
[im 33/45]
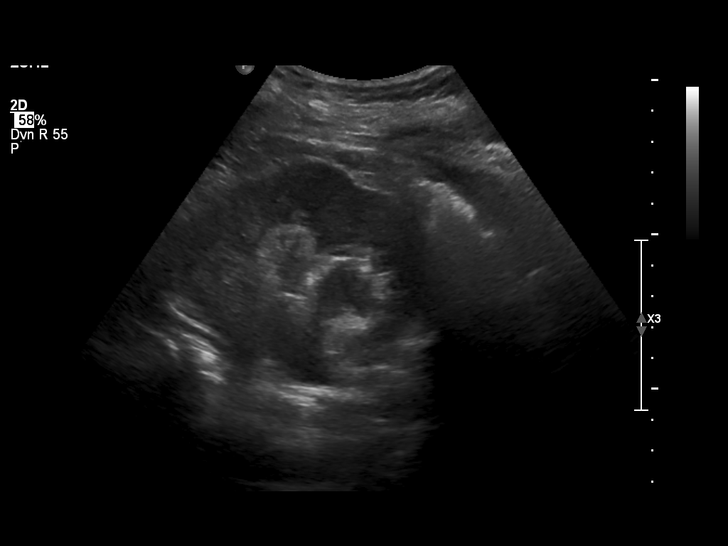
[im 36/45]
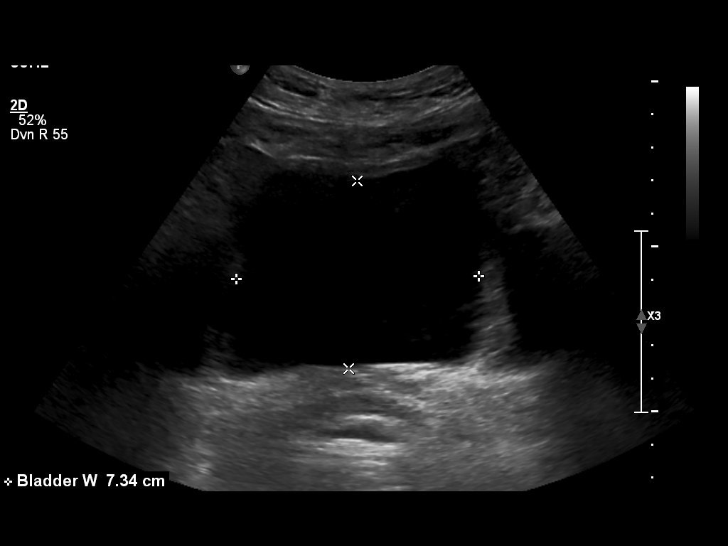
[im 42/45]
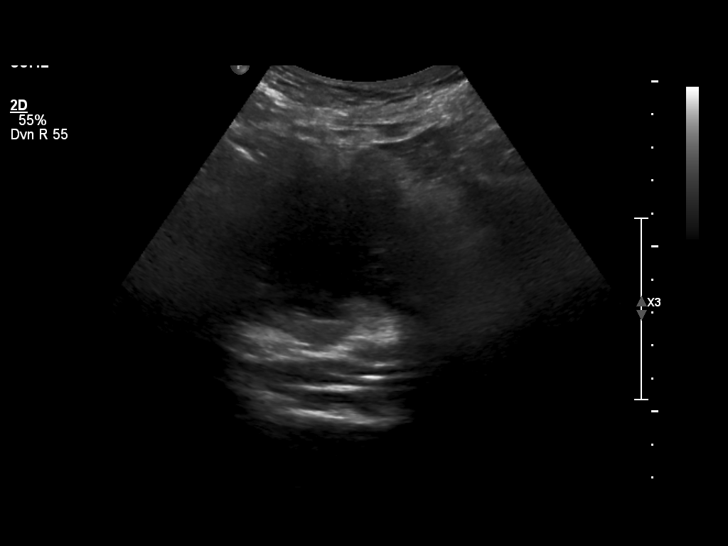
[im 45/45]
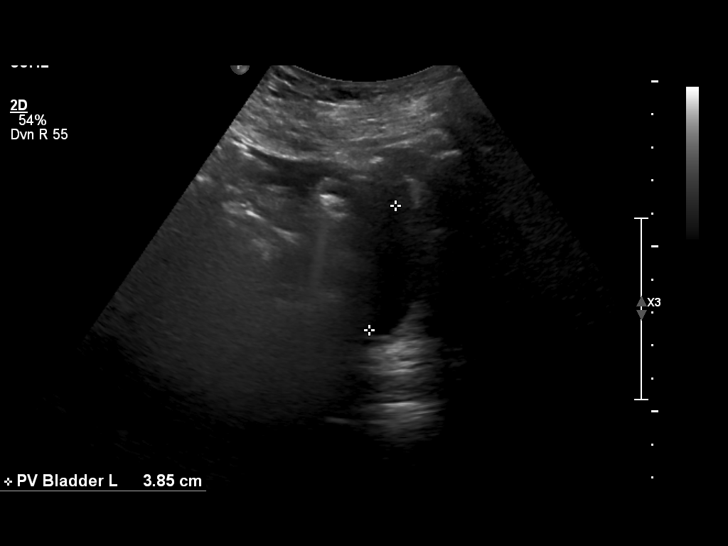

[13 of 16 positions shown; findings below may reference images not displayed]

EXAM

INDICATION

Bilateral flank pain. History of kidney stones.

TECHNIQUE

Multiple static grayscale and color Doppler ultrasound images are provided from a bilateral renal
ultrasound.

COMPARISONS

Reference is made to CT scan of the abdomen and pelvis dated 10/12/2019.

FINDINGS

The right kidney measures 8 x 5.7 x 7.3 cm. There is no evidence of hydronephrosis or obstructive
uropathy. There are no solid or cystic renal masses. In the upper pole collecting system there is a
7 x 5 millimeter area of increase echogenicity that may be related to a small renal calculus.

The left kidney measures 11.1 x 6.6 x 4.8 cm. There is mild left-sided hydronephrosis. There are no
solid or cystic renal masses. No definite evidence of nephrolithiasis is present. Previously
identified large calculus in the left renal pelvis is not seen on this examination. However, it may
be obscured by fecal material and overlying bowel gas.

Bilateral ureteral jets are identified. The urinary bladder has a prevoid volume of 173.4 cc with a
postvoid residual of 23.9 cc.

IMPRESSION

1. Mild left-sided hydronephrosis. Previously identified large calculus in the left renal pelvis
measuring up to 12 x 10 [address] on [address] scan of the abdomen and pelvis dated 10/12/2019 is not
identified on the current exam.. It may be obscured by overlying bowel gas and fecal material.

2. There is a 7 x 5 millimeter nonobstructing calculus in the upper pole of the right kidney.

3. Bilateral ureteral jets are identified. There is a postvoid residual of 23.9 cc.

Tech Notes:

bil flank pain; hx of kidney stones

## 2020-03-16 IMAGING — CR ABDOMEN
2 series · 2 of 2 positions shown · non-contrast
Comparison: none

[abd supine x-wise]
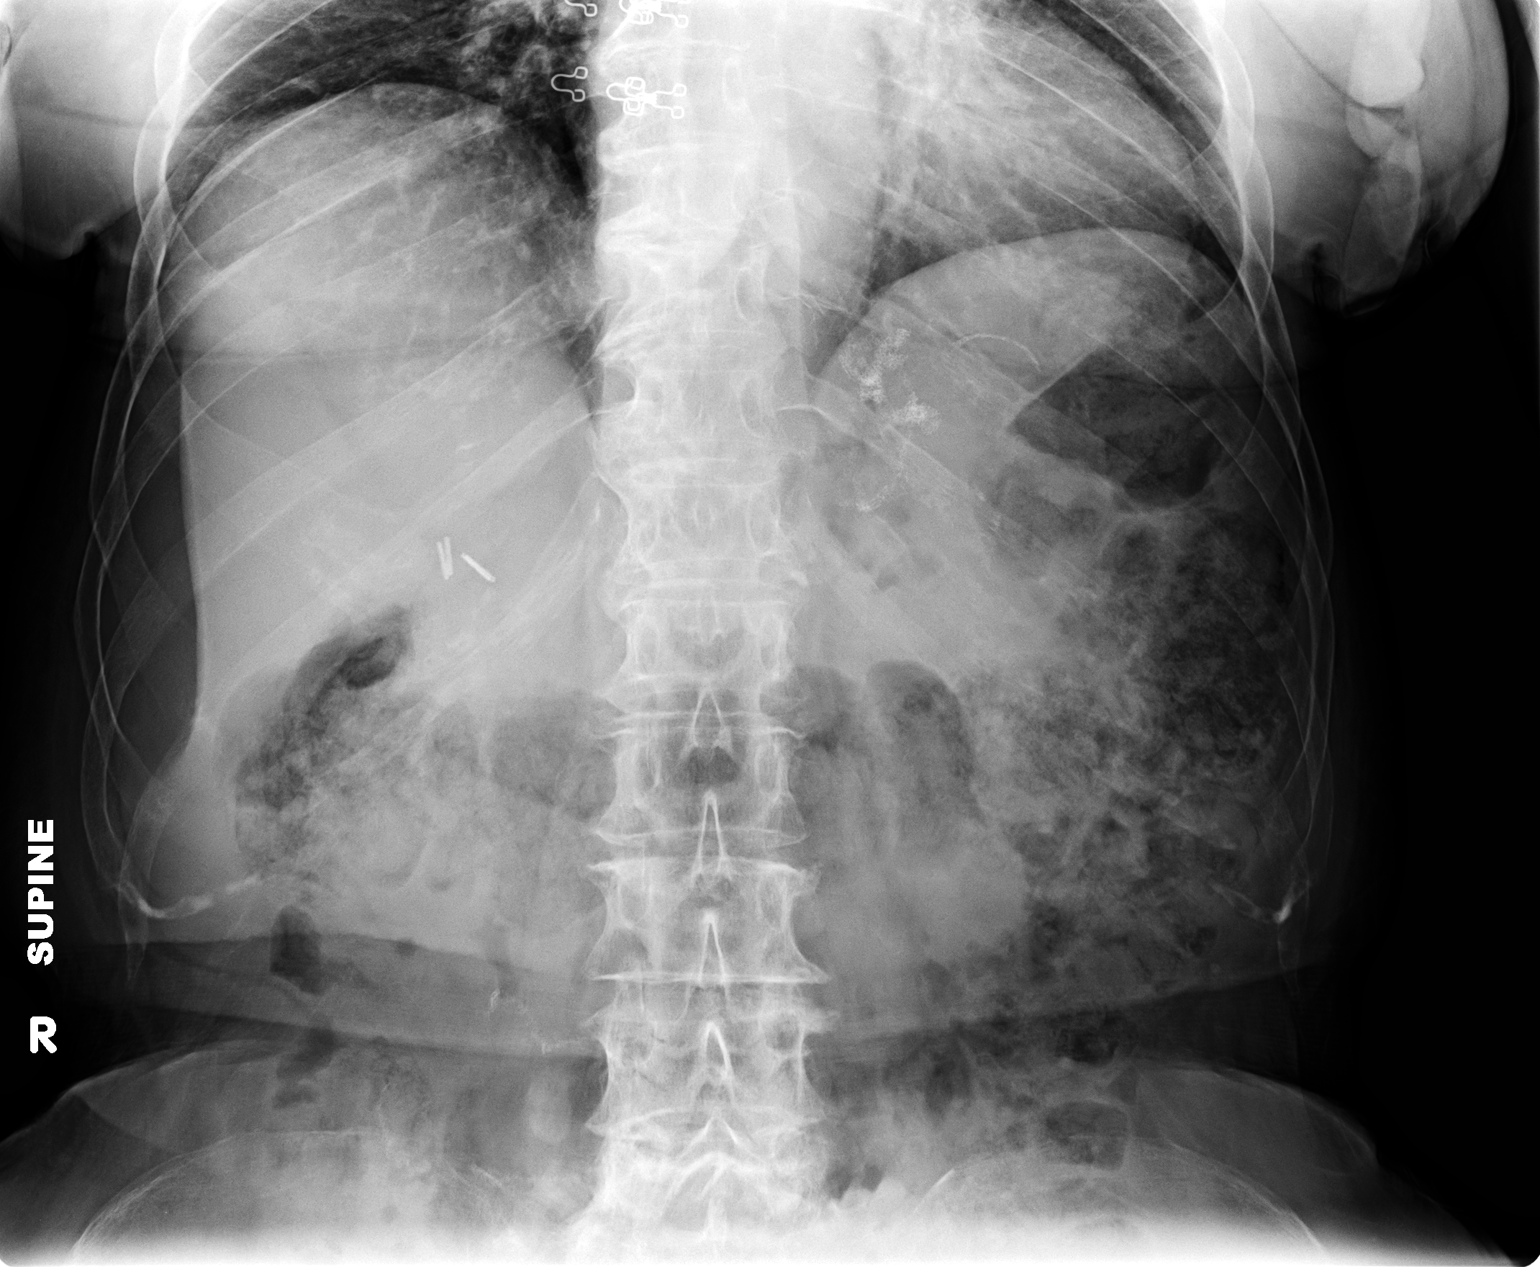

[abdomen supine kub]
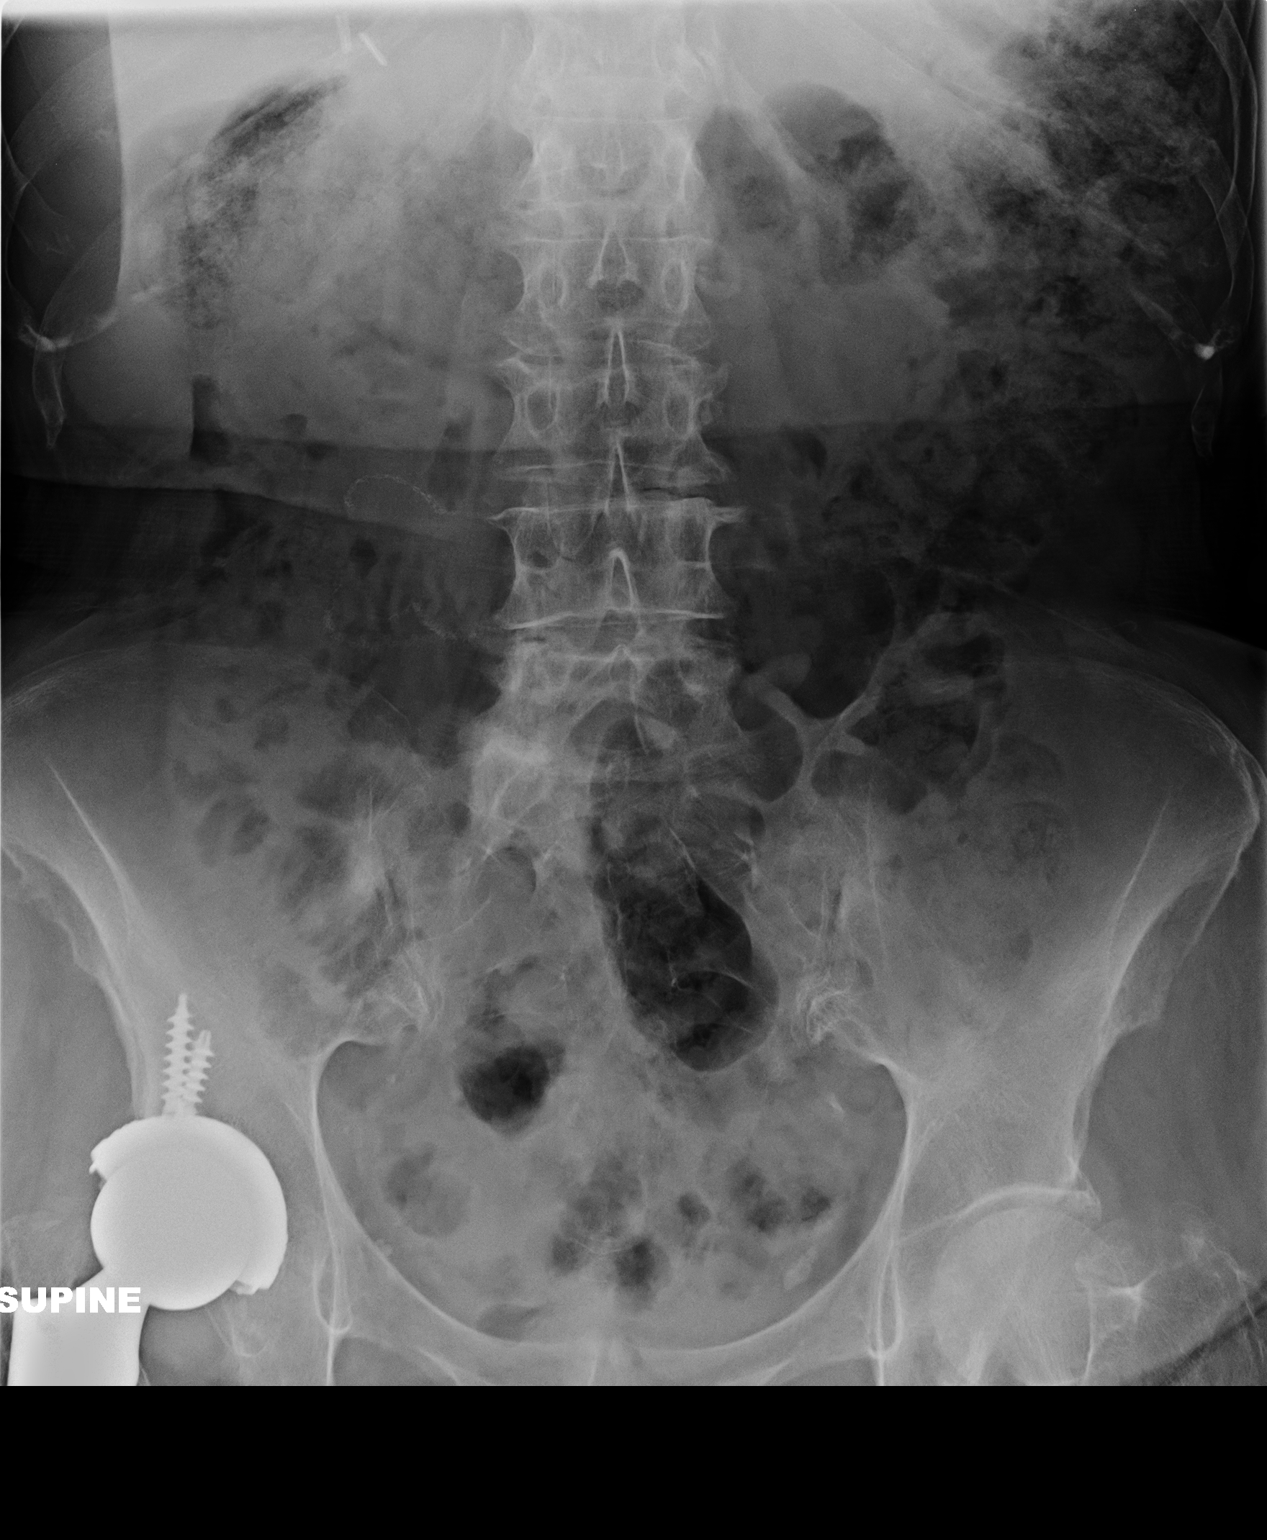

[2 of 2 positions shown; findings below may reference images not displayed]

DIAGNOSTIC STUDIES

EXAM

XR kub

INDICATION

Flank pain, hx of stones
pt states hx of large kidney stone in September 2019, had procedure done 3 xs to break up stones. states
she is still having low back pain. sx hx of appy. AK/BM

TECHNIQUE

Standard KUB was obtained.

COMPARISONS

April 27, 2018

FINDINGS

Moderate fecal material is noted throughout the entire colon. Minimal small bowel gas is seen
without distention. Right hip arthroplasty and surgical clips in the right upper quadrant are noted.
No concerning osseous lesions are seen.

IMPRESSION

Moderate retained fecal material throughout the colon. Symptoms of constipation should be excluded
clinically.

No evidence for obstruction.

Postop changes as described including suture material in the right abdomen.

No obvious calcifications overlie the kidneys however kidneys are partially obscured due to
overlying fecal material.

Tech Notes:

pt states hx of large kidney stone in September 2019, had procedure done 3 xs to break up stones. states
she is still having low back pain. sx hx of appy. AK/BM

## 2020-03-31 IMAGING — CT STONE PROTOCOL(Adult)
2 of 3 series · 11 of 46 positions shown, 12 images · non-contrast
Comparison: none

[Series 2: abdomen ax 2.00 br40 s3 · axial · 0.48mm/px · z∈[+1395,+1773]mm · 8 of 218 slices shown, 9 images]
[im 15/218  soft-tissue]
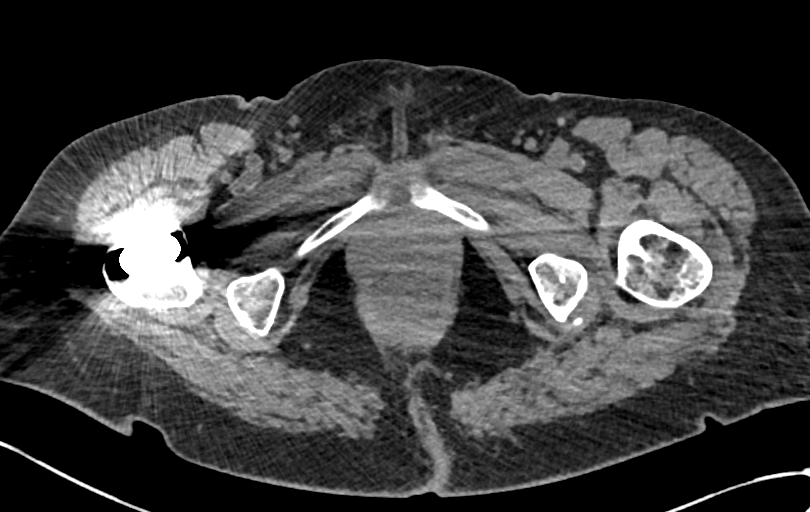
[im 15/218  bone]
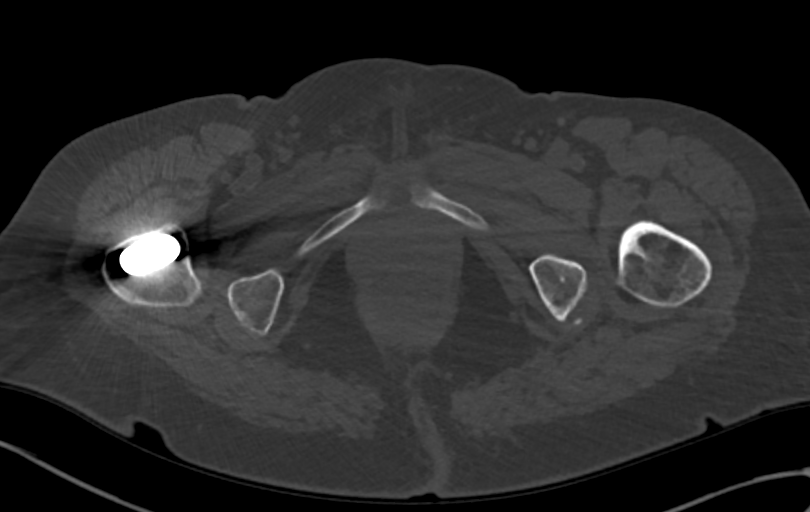
[im 43/218  soft-tissue]
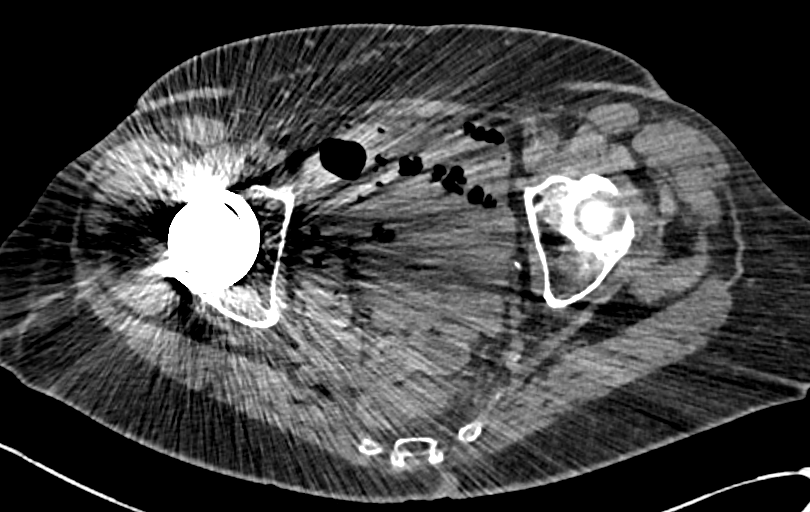
[im 71/218  soft-tissue]
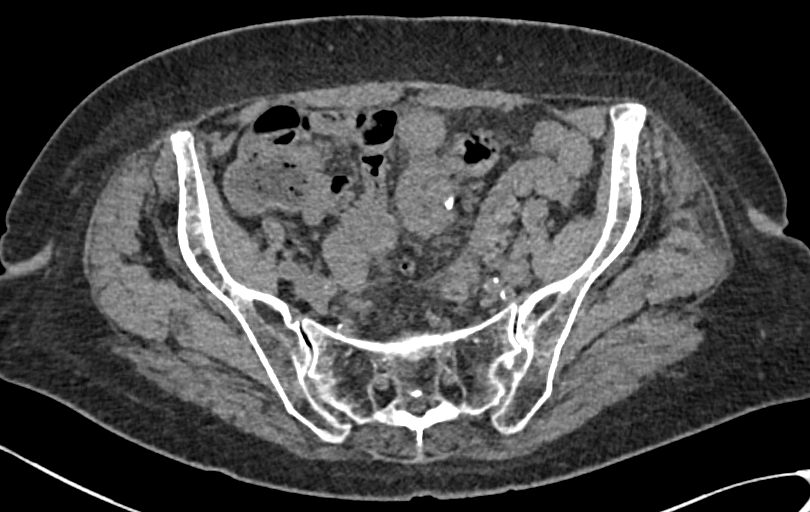
[im 99/218  soft-tissue]
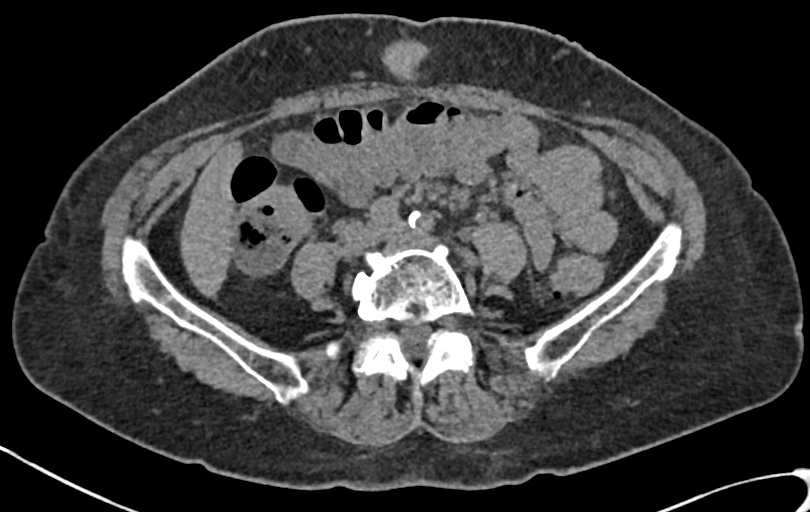
[im 120/218  soft-tissue]
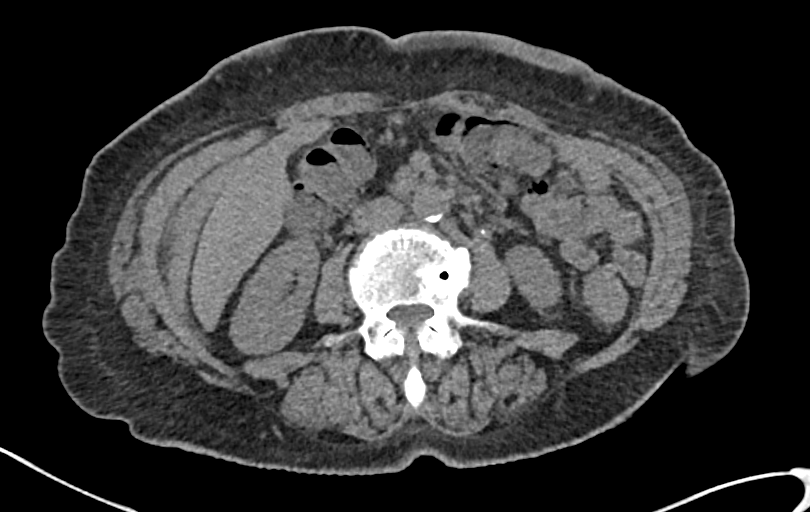
[im 148/218  soft-tissue]
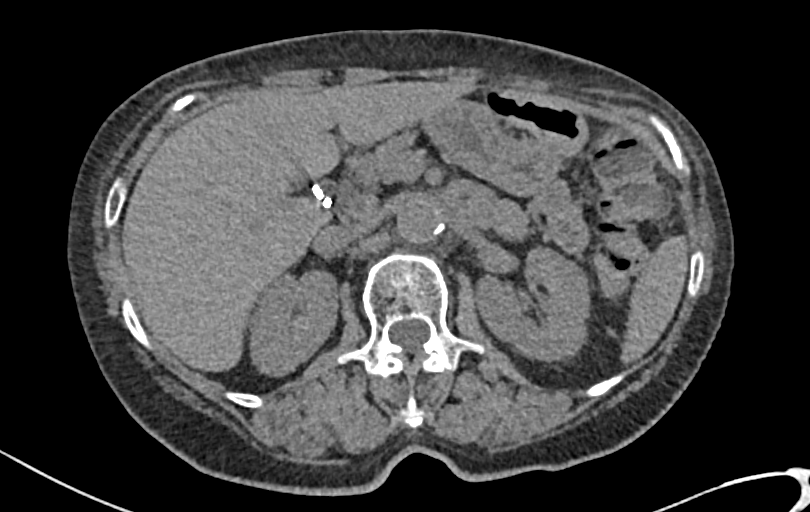
[im 176/218  soft-tissue]
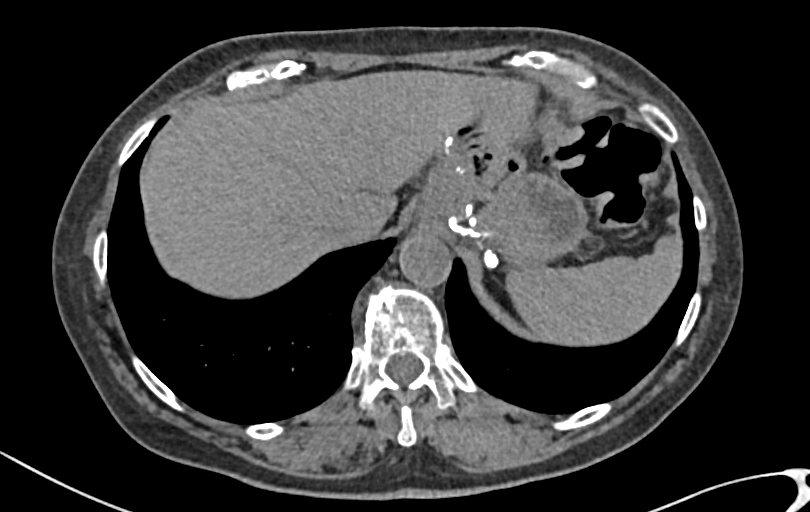
[im 204/218  soft-tissue]
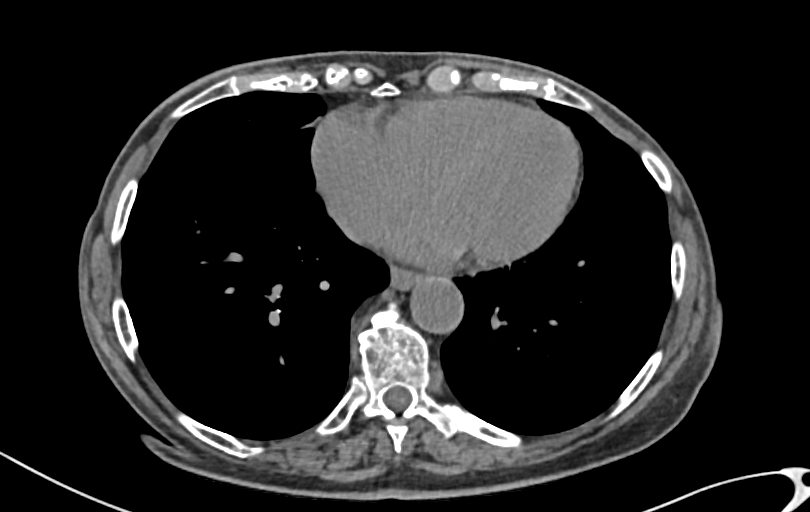

[Series 4: abdomen cor 2.00 br40 s3 · coronal · 0.77mm/px · 3 of 123 slices shown]
[im 41/123  soft-tissue]
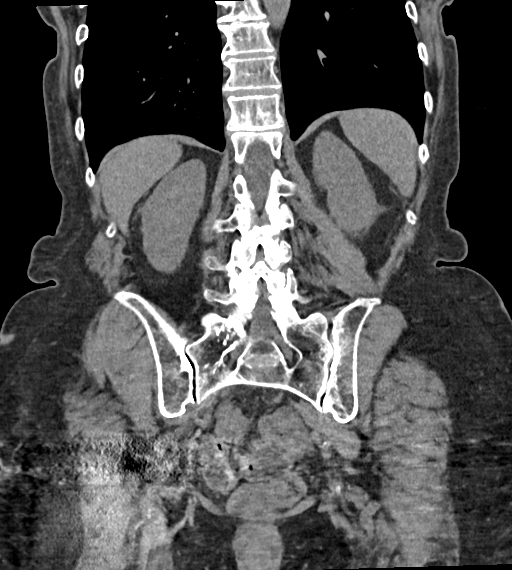
[im 55/123  soft-tissue]
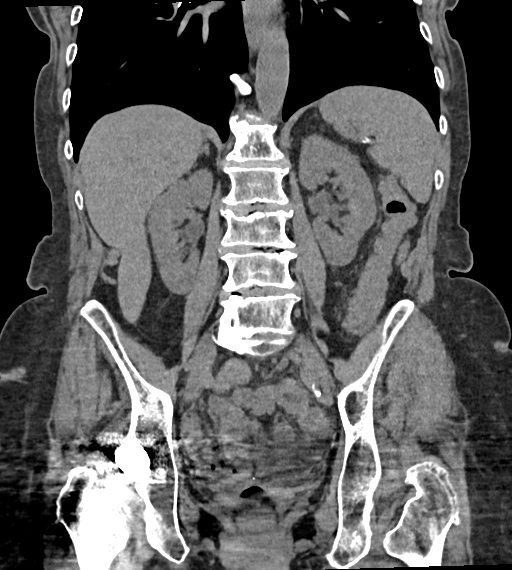
[im 68/123  soft-tissue]
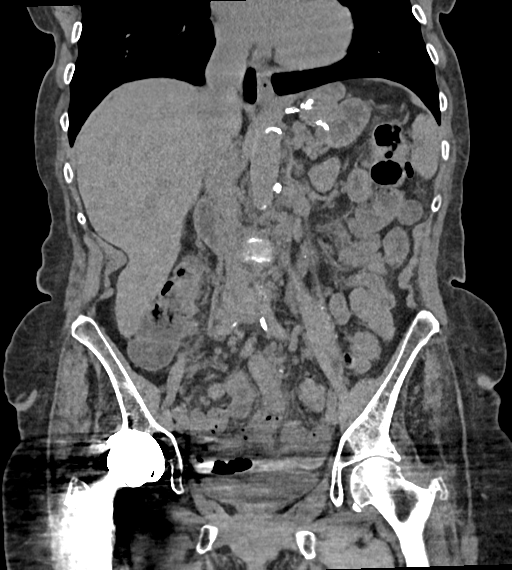

[11 of 46 positions shown; findings below may reference images not displayed]

EXAM

CT abd/pel wo con

INDICATION

Urinary calculus

TECHNIQUE

CT of the abdomen and pelvis was performed. All CT scans at this facility use dose modulation,
iterative reconstruction, and/or weight based dosing when appropriate to reduce radiation dose to as
low as reasonably achieved.

# of CT scans in the past year: 1

# of Myocardial perfusion scans this past year: 0

COMPARISONS

10/12/2019

FINDINGS

Lung bases: No pleural effusion or suspicious pulmonary nodule in the lung bases. Normal cardiac
size without a pericardial effusion.

Liver: Normal hepatic size without a suspicious focal lesion.

Gallbladder and Biliary Tree: Cholecystectomy. No intrahepatic or extrahepatic biliary dilation.

Spleen: Unremarkable

Pancreas: Unremarkable

Adrenal Glands: Unremarkable

Kidneys: Nonspecific asymmetric mild left perinephric edema. Mild left pelviectasis inflammatory
stranding. 2 millimeter left ureteral pelvic junction stone (series 2, image 99). No hydronephrosis.
Additional sub cm nonobstructive right greater than left renal caliceal stones.

Bladder: Unremarkable for the degree of distention.

Pelvic Organs: Limited evaluation in appearance of hysterectomy

Bowel: Postoperative changes to the stomach and correlate with surgical history. Imaging findings
are suggestive of gastric bypass. History appendectomy. No evidence of bowel obstruction. Small
amount of sigmoid and descending colonic diverticulosis. There is no free air.

Ascites: Absent

Lymphadenopathy: Small similar imaging appearance sub cm retroperitoneal lymph nodes which may be
reactive

Vasculature: No aneurysmal dilatation. Vascular calcifications.

Abdominal Wall and Mesentery: Multiple abdominal fold with trace inflammatory stranding in the mid
abdomen, and correlate for panniculitis (series 6, image 99).

Musculoskeletal: Superior endplate scalloping/endplate height loss of L3. Right hip arthroplasty.
Mild focal fatty atrophy of the gluteus musculature.

IMPRESSION
1. 2 millimeter left ureteral pelvic junction stone with mild pelviectasis in surrounding
inflammatory stranding.
2. Please see above for additional findings.

Tech Notes:

PT STATES HAS CONTINUED FLANK PAIN LT>RT. HX OF RENAL STONES. HX OF appy, chole, hysterectomy,
gastric bypass. CT/NM 1/0. TJ

## 2020-09-28 IMAGING — CR ABDOMEN
1 series · 1 of 1 positions shown · non-contrast
Comparison: none

[abdomen supine kub]
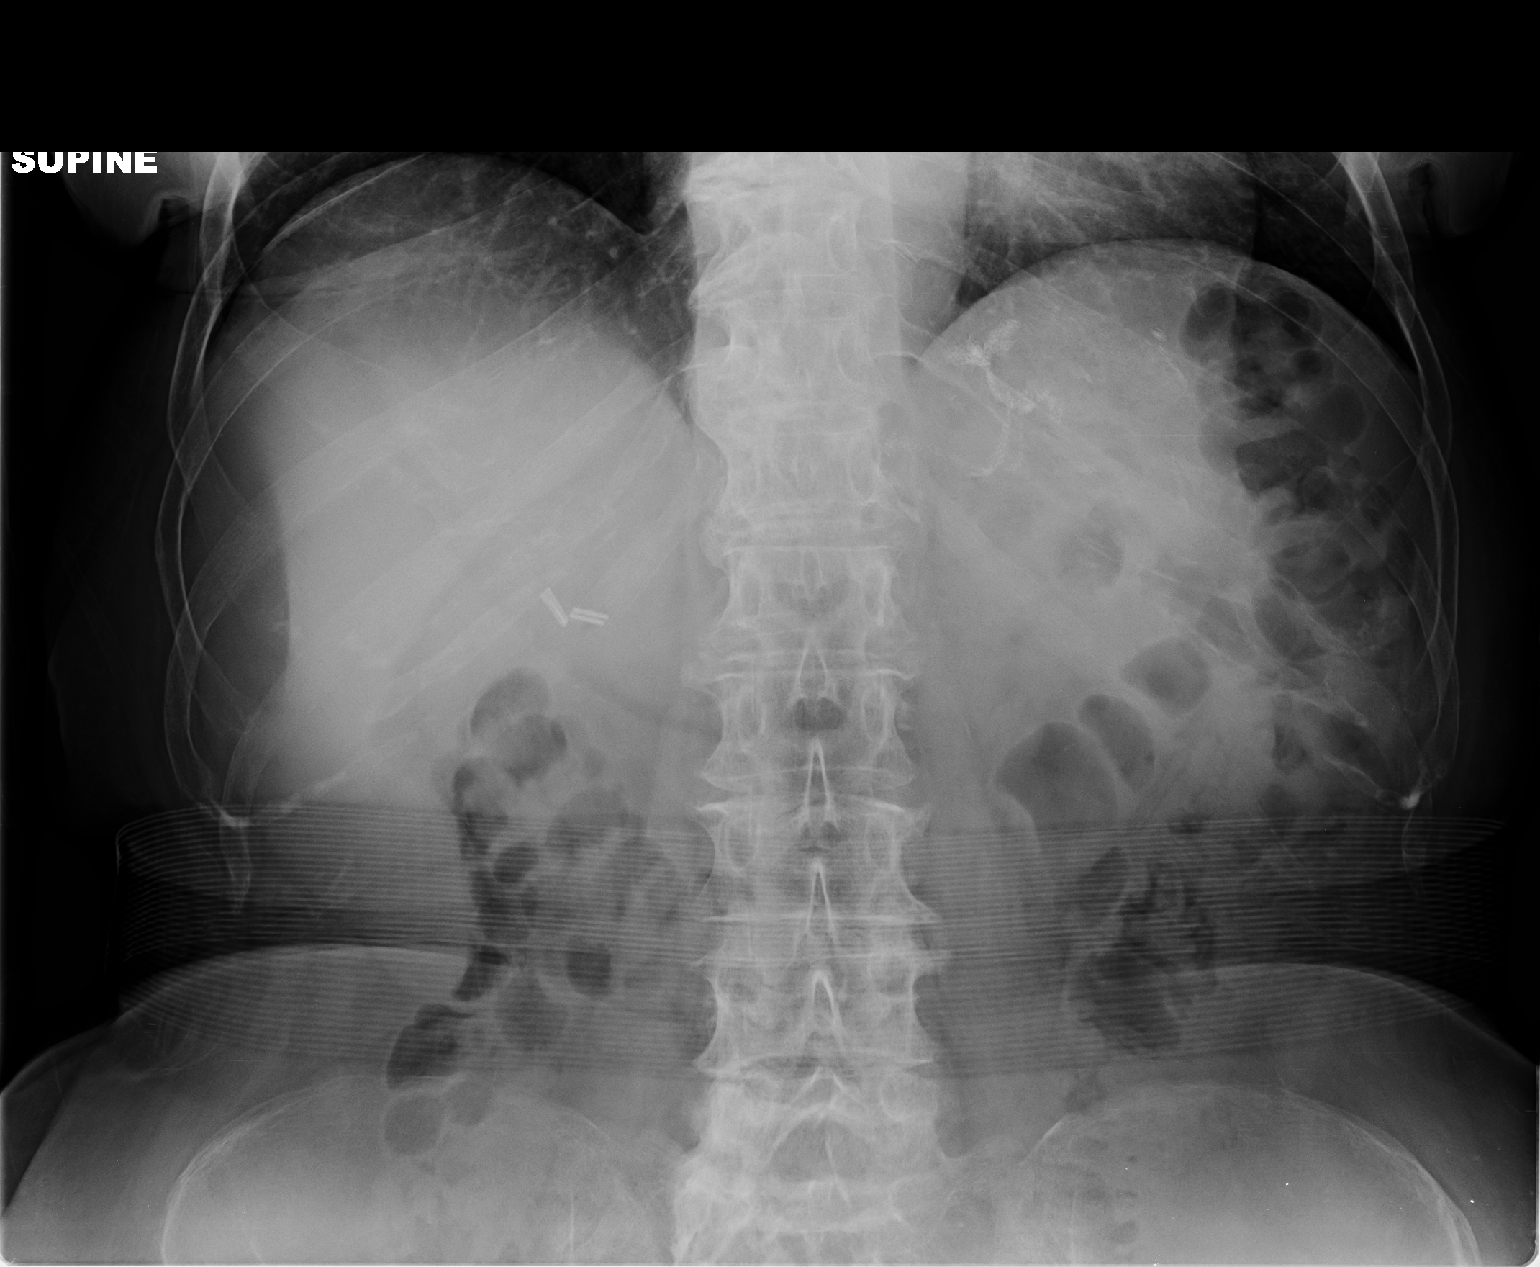

[1 of 1 positions shown; findings below may reference images not displayed]

DIAGNOSTIC STUDIES

EXAM

XR kub

INDICATION

renal / stones
Follow-up. Patient states she had renal stones about 5 months ago.

TECHNIQUE

Standard KUB was obtained.

COMPARISONS

March 16, 2020

FINDINGS

Surgical clips are noted the right upper quadrant. Scattered calcifications are felt to be
vascular. Right hip arthroplasty is noted. Minimal air is noted throughout the small large bowel
which may indicate mild ileus or gastroenteritis. There is no evidence for obstruction. No free air
is seen.

IMPRESSION

Minimal air throughout the small large bowel which may indicate ileus or gastroenteritis.

Scattered calcifications which likely are vascular. There is concern for renal calculi, CT scanning
may be necessary.

Tech Notes:

Follow-up. Patient states she had renal stones about 5 months ago.

## 2021-01-19 IMAGING — CR SHOULDCMRT
4 series · 4 of 4 positions shown · non-contrast
Comparison: none

[shoulder external]
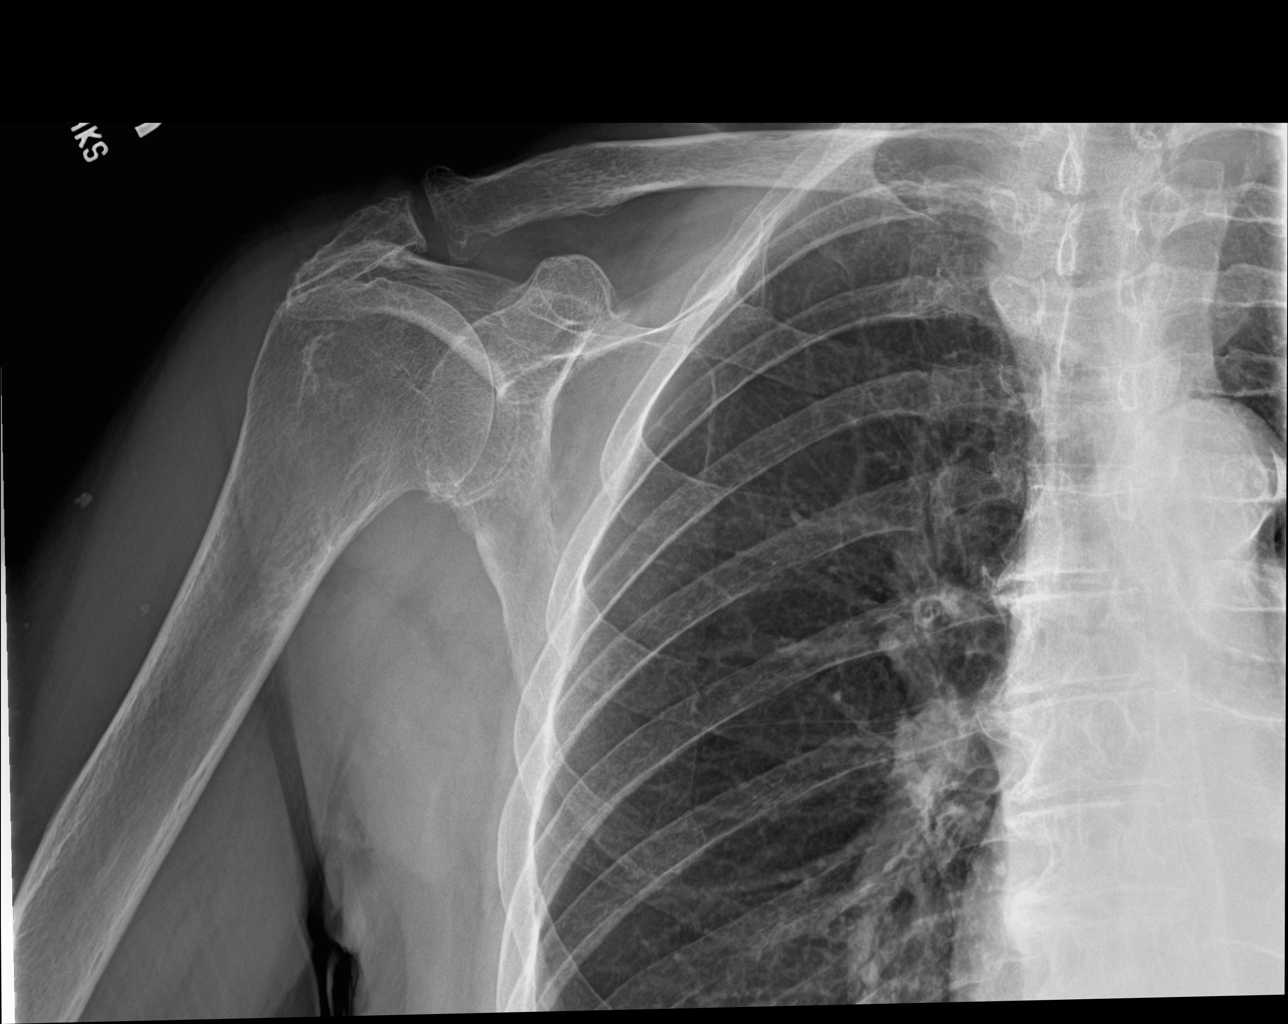

[shoulder internal]
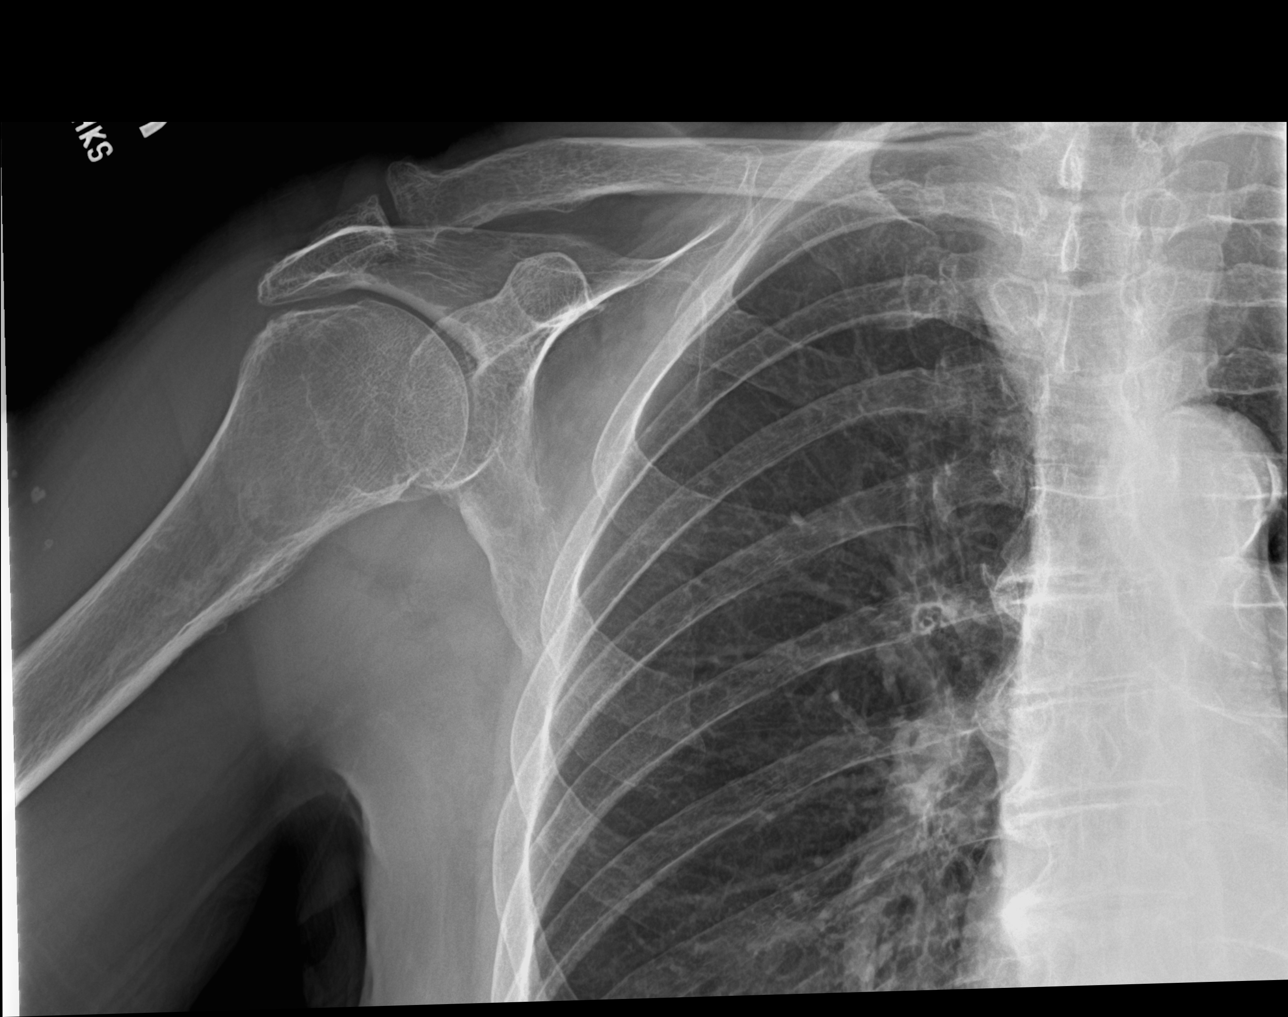

[shoulder y-view]
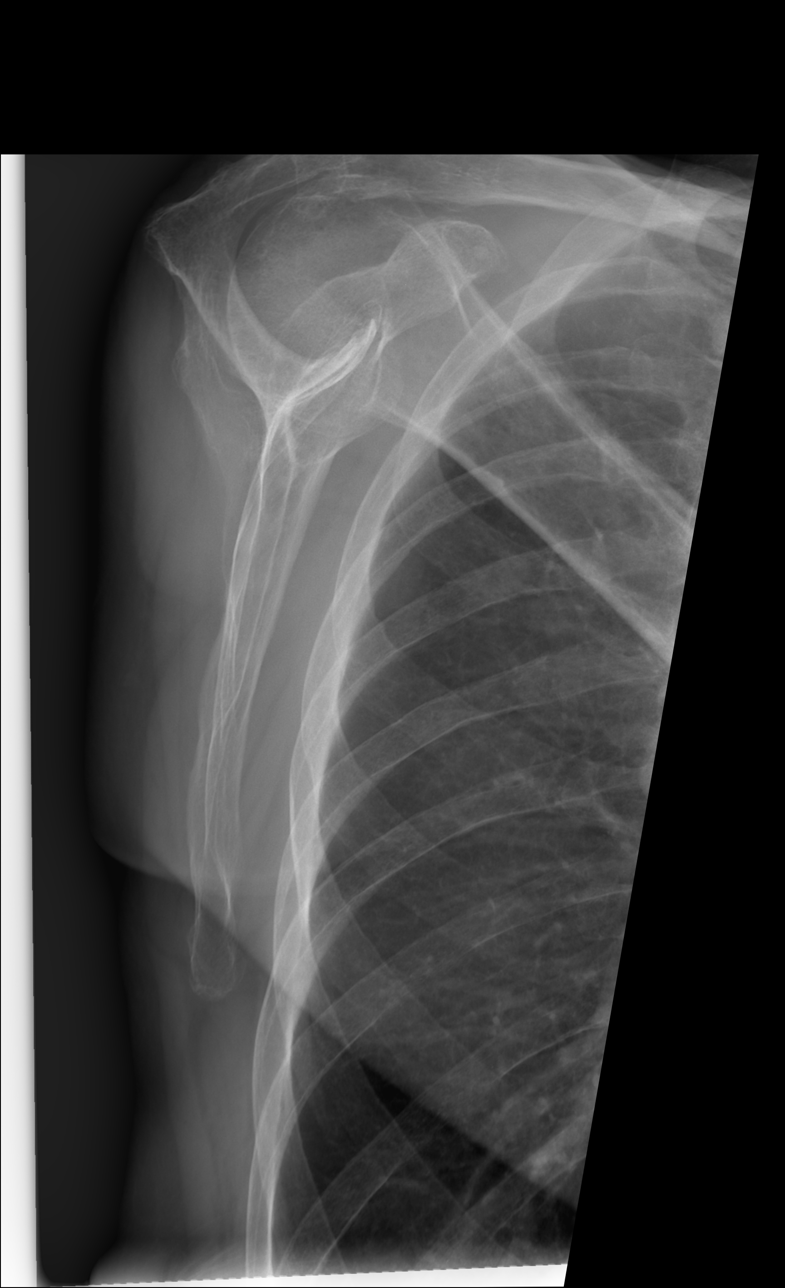

[shoulder axillary]
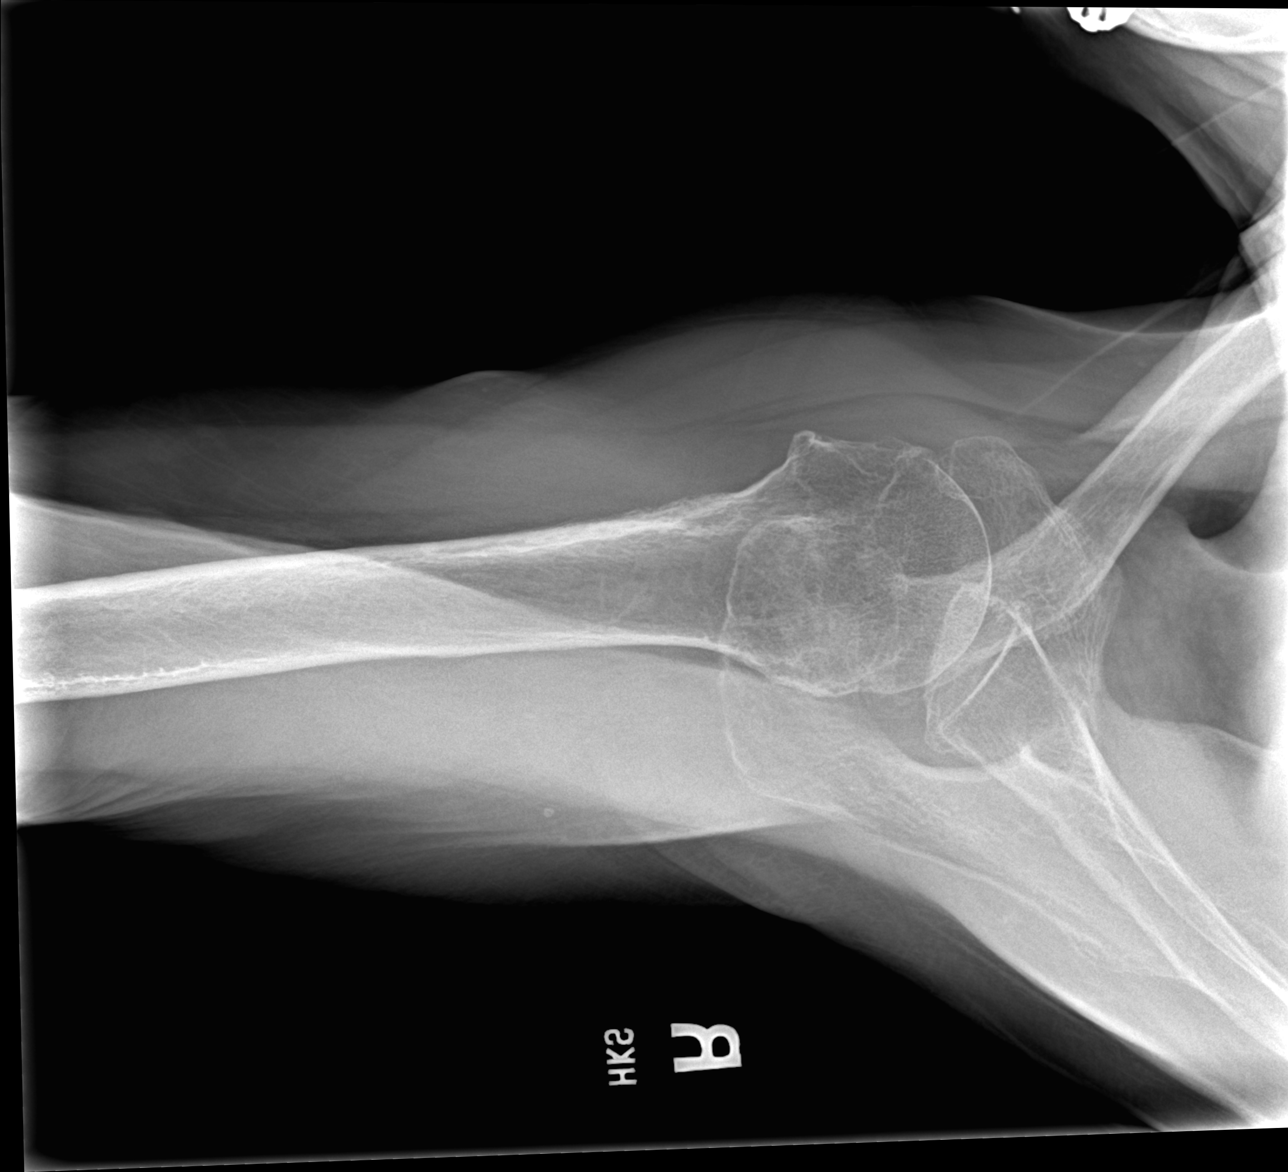

[4 of 4 positions shown; findings below may reference images not displayed]

EXAM

RADIOLOGICAL EXAMINATION SHOULDER; COMPLETE, MINIMUM 2 VIEWS CPT 12929

INDICATION

Right shoulder pain. Status post fall 1 month ago, pain and bruising, limited range of motion.

TECHNIQUE

4  views of the right  shoulder were acquired.

COMPARISONS

FINDINGS

AP internal, AP external and Y views of the  right  shoulder show no acute fracture or dislocation.
AC joint appears aligned with moderate degenerative change. Moderate osteophytic spurring is
identified at the glenohumeral joint. Adjacent ribs appear normal. Soft tissues appear normal.

IMPRESSION

There are no acute fractures or subluxations of the right shoulder. Moderate degenerative changes
of the AC joint and glenohumeral joint are noted.

Tech Notes:

fall 1 month ago, pain and bruising, limited rom

## 2021-02-03 IMAGING — MR Shoulder^ROUTINE
3 series · 14 of 14 positions shown · non-contrast
Comparison: none

[Series 5: T1 · oblique · 4.0mm · 0.44mm/px · 5 of 5 slices shown]
[im 1/5]
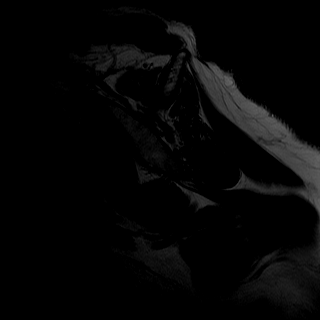
[im 2/5]
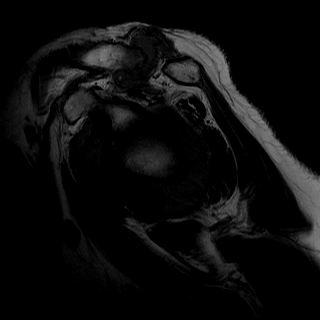
[im 3/5]
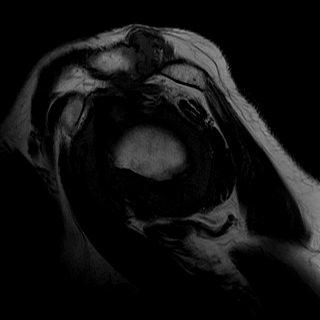
[im 4/5]
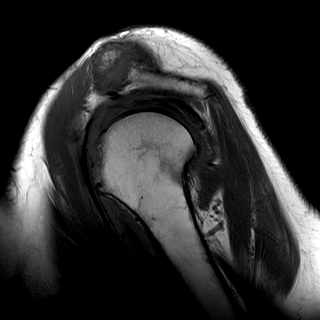
[im 5/5]
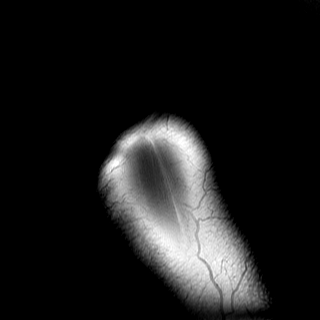

[Series 6: STIR · oblique · 4.0mm · 0.27mm/px · 6 of 6 slices shown (1 of 2)]
[im 1/6]
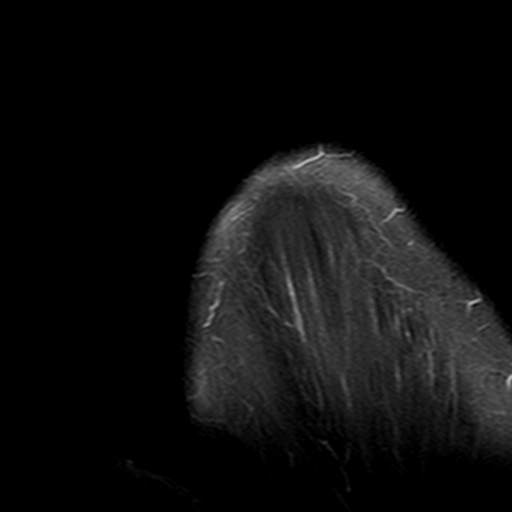
[im 2/6]
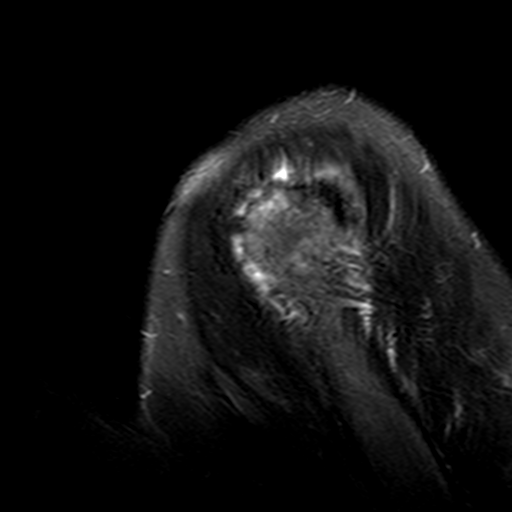
[im 3/6]
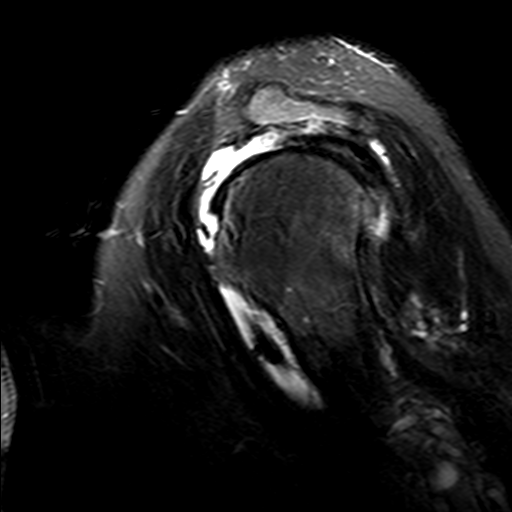
[im 4/6]
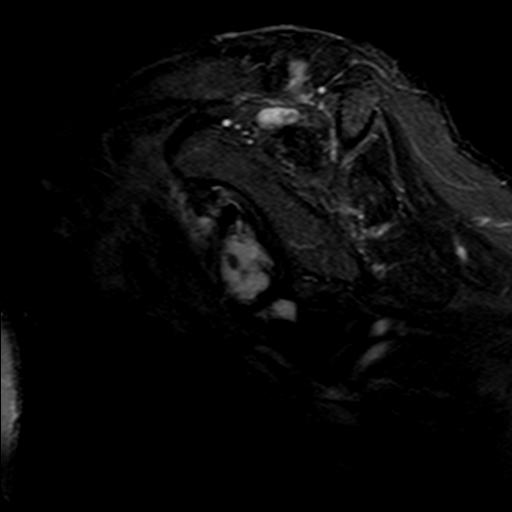
[im 5/6]
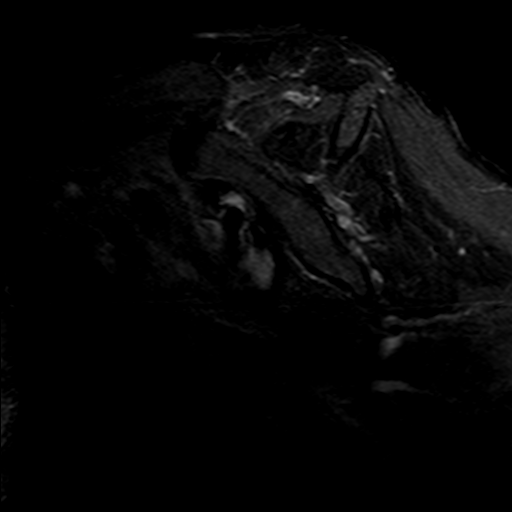
[im 6/6]
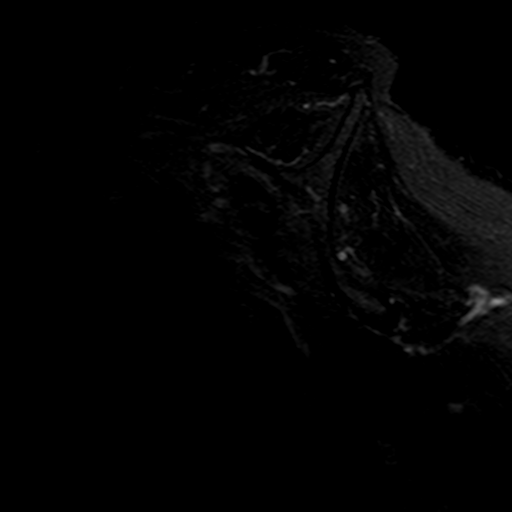

[Series 8: STIR · oblique · 4.0mm · 0.27mm/px · 3 of 3 slices shown (2 of 2)]
[im 1/3]
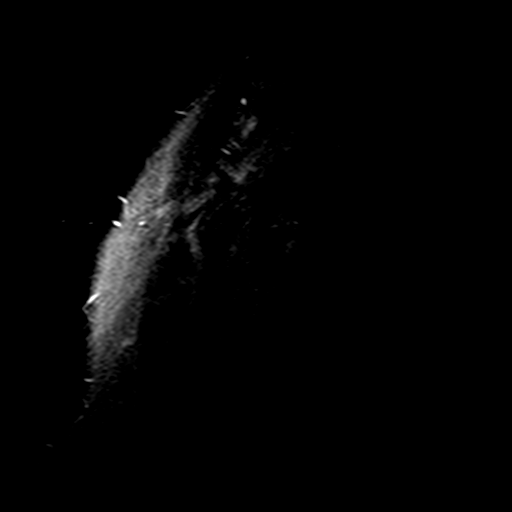
[im 2/3]
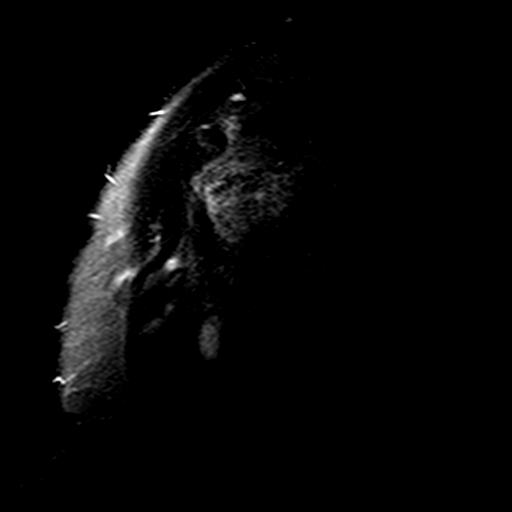
[im 3/3]
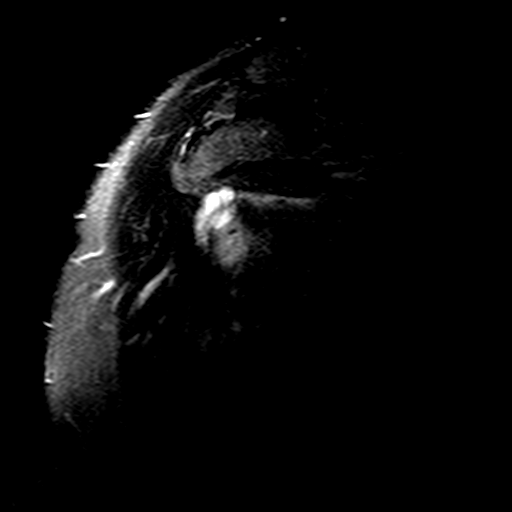

[14 of 14 positions shown; findings below may reference images not displayed]

DIAGNOSTIC STUDIES

EXAM

MRI of the right shoulder without contrast.

INDICATION

shoulder pain
RT SHOULDER PAIN WITH LIMITED ROM.  PT DOES A LOT OF HEAVY LIFTING DUE TO TAKING CARE OF HER
HUSBAND.  RG

TECHNIQUE

Oblique coronal, oblique sagittal, and axial images were obtained variable T1 and T2 weighting.

COMPARISONS

None available

FINDINGS

There is a 2.6 cm full-thickness tear of the anterior rotator cuff tendon. There is only minimal
atrophy of the supraspinatus and infra spinatus muscles.

The sub scapularis tendon is within normal limits. The bicipital tendon is medially subluxed.

A large shoulder effusion with minimal fluid in the subcoracoid bursa. Advanced degenerative
changes of the AC joint are noted. Fluid is noted within the AC joint. Degenerative changes of the
AC joint are evident.

IMPRESSION

2.6 cm full-thickness tear of the anterior rotator cuff tendon with minimal atrophy of the supra
and infraspinatus muscles.

Medial subluxation of the bicipital tendon.

Large shoulder effusion with minimal fluid in the subcoracoid bursa.

Marked degenerative changes of the AC joint with fluid in the AC joint.

Tech Notes:

RT SHOULDER PAIN WITH LIMITED ROM.  PT DOES A LOT OF HEAVY LIFTING DUE TO TAKING CARE OF HER
HUSBAND.  RG

## 2021-06-20 IMAGING — CR [ID]
5 series · 5 of 5 positions shown · non-contrast
Comparison: none

[lspine ap]
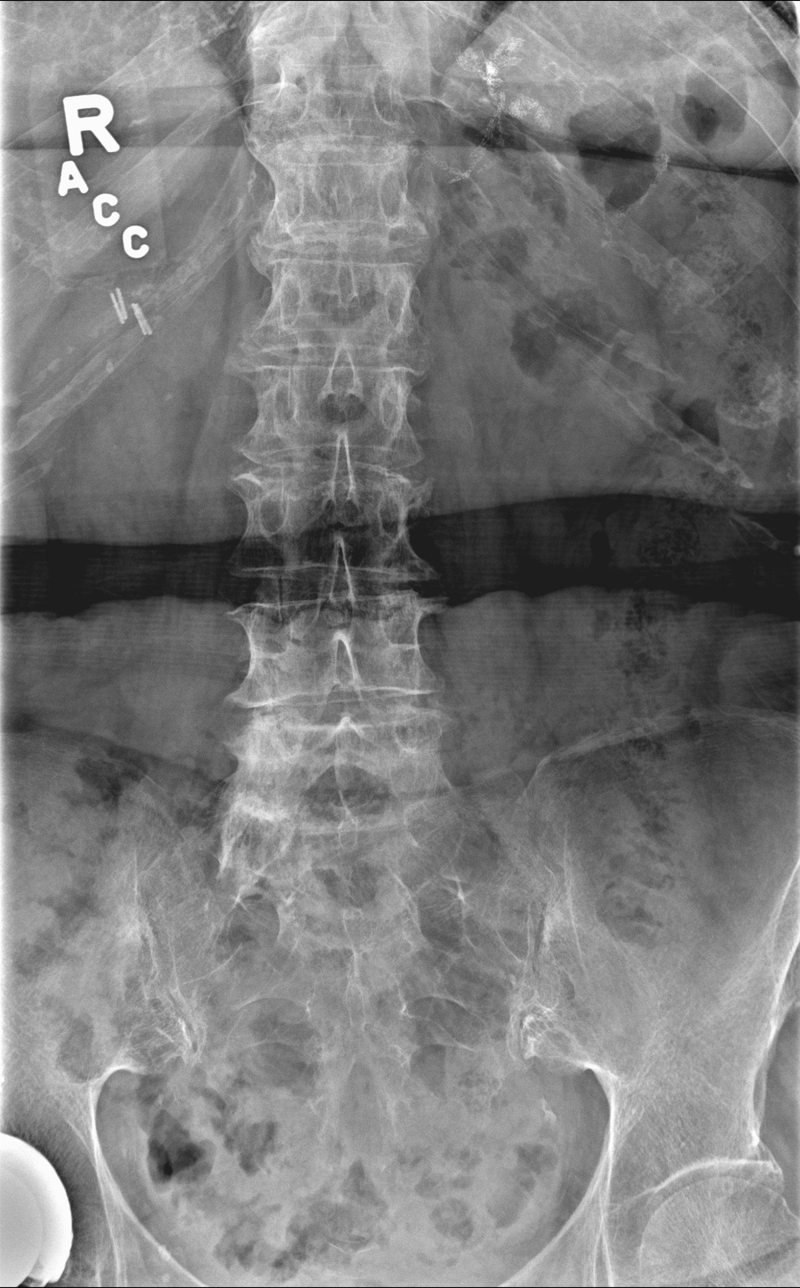

[lspine obl (1 of 2)]
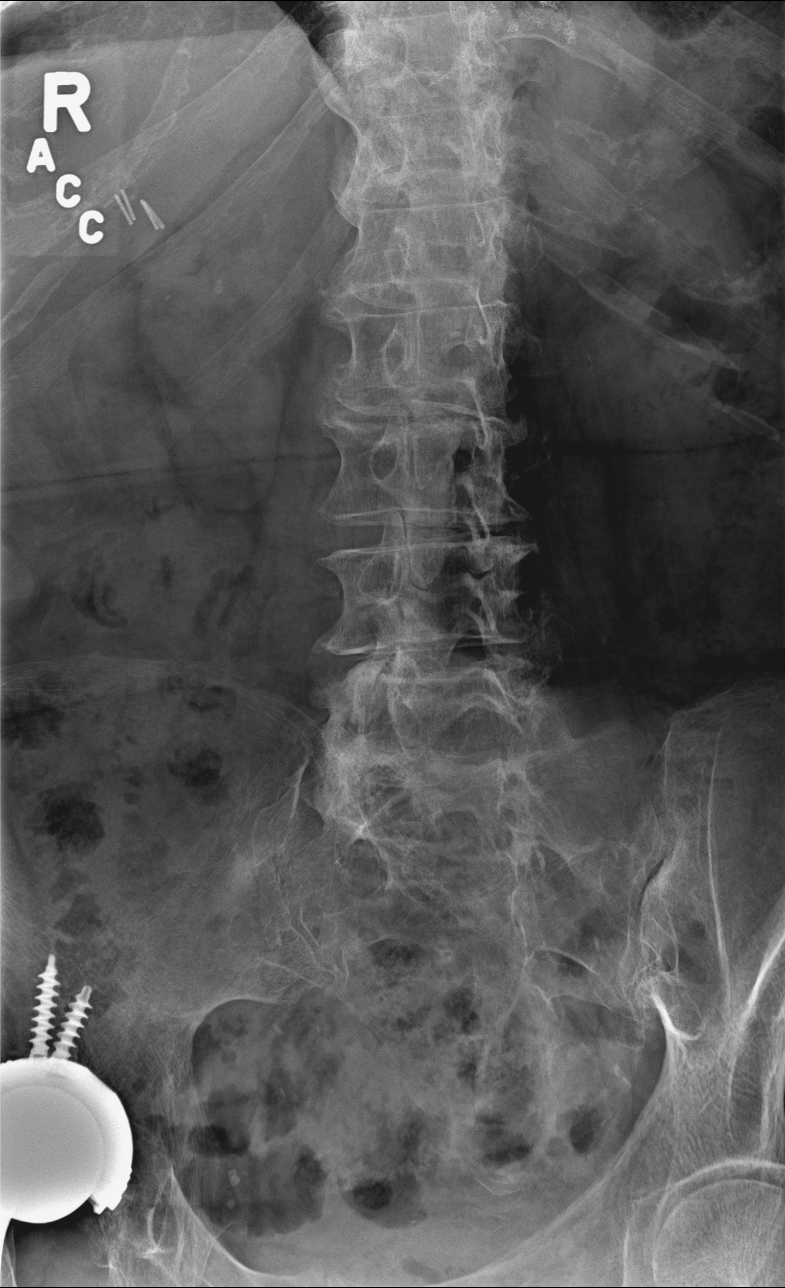

[lspine obl (2 of 2)]
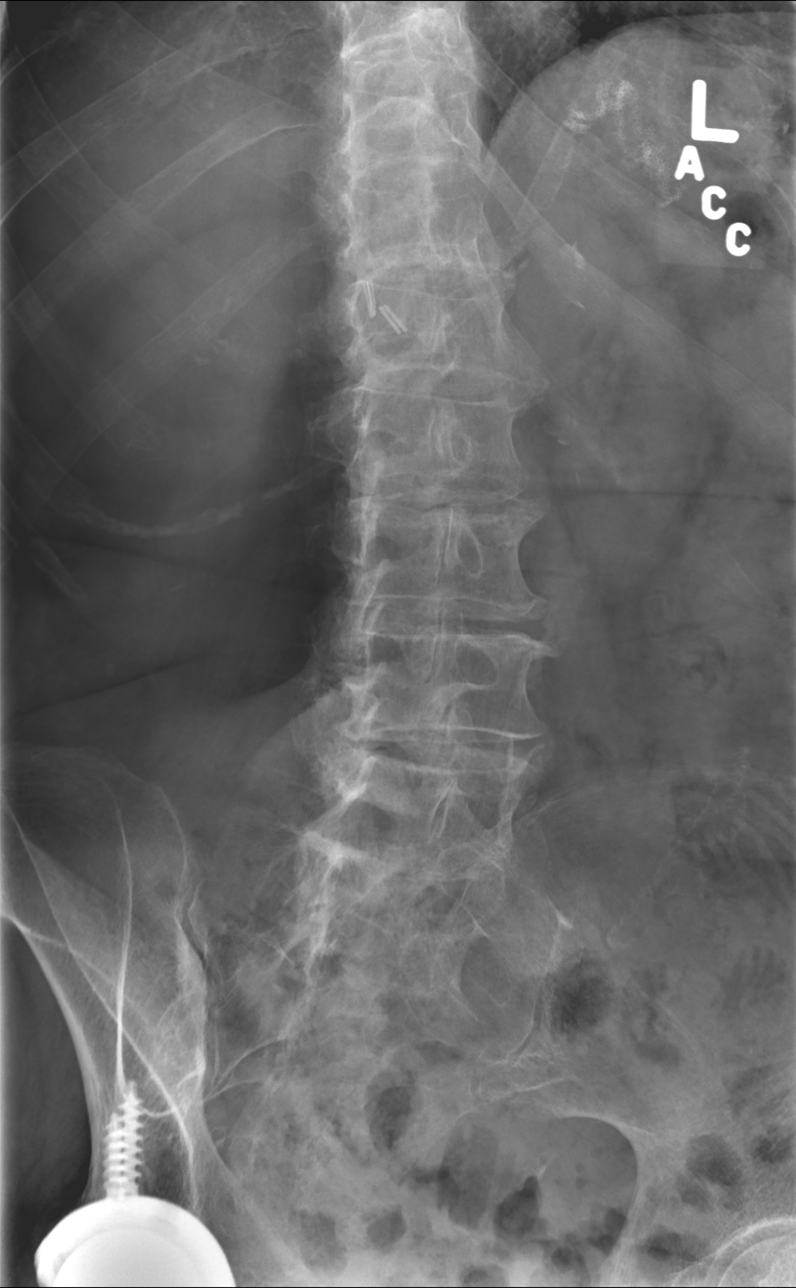

[lspine lat]
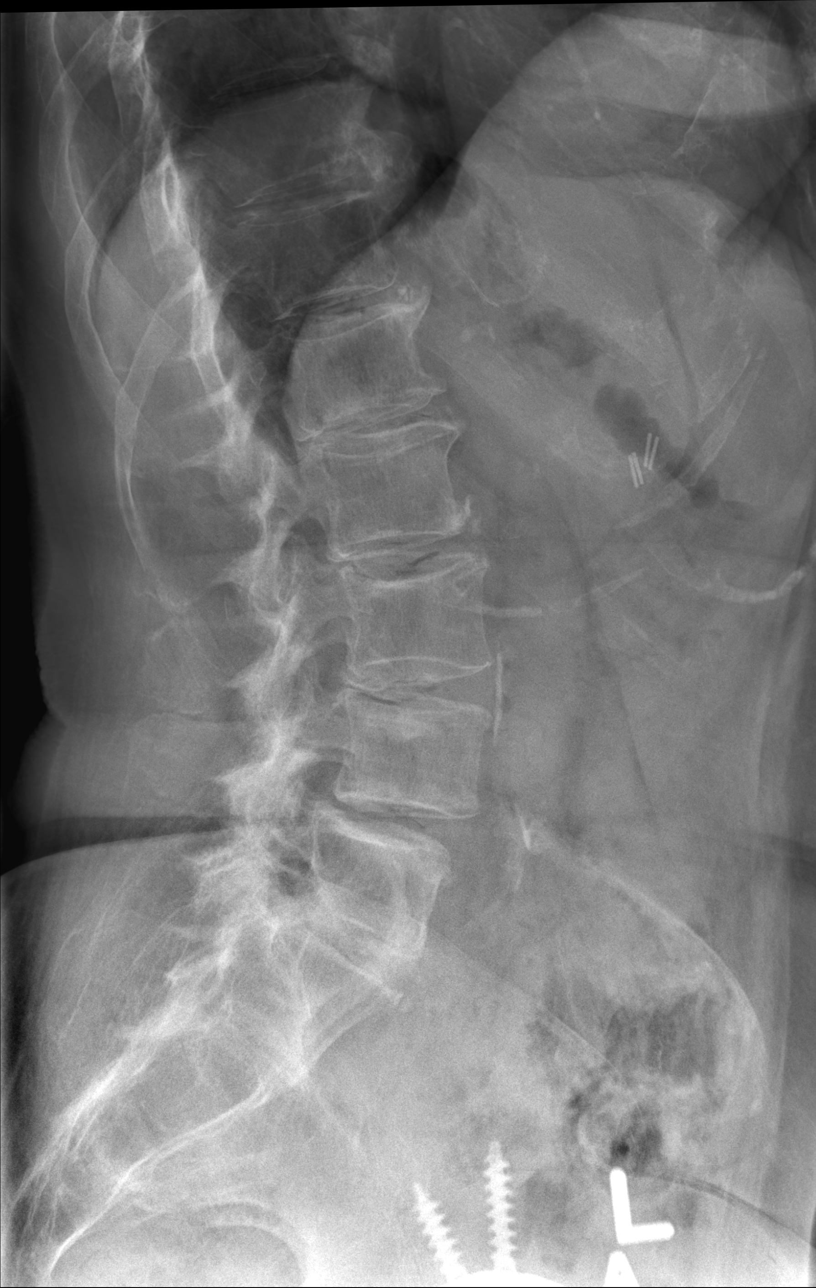

[lspine l5-s1]
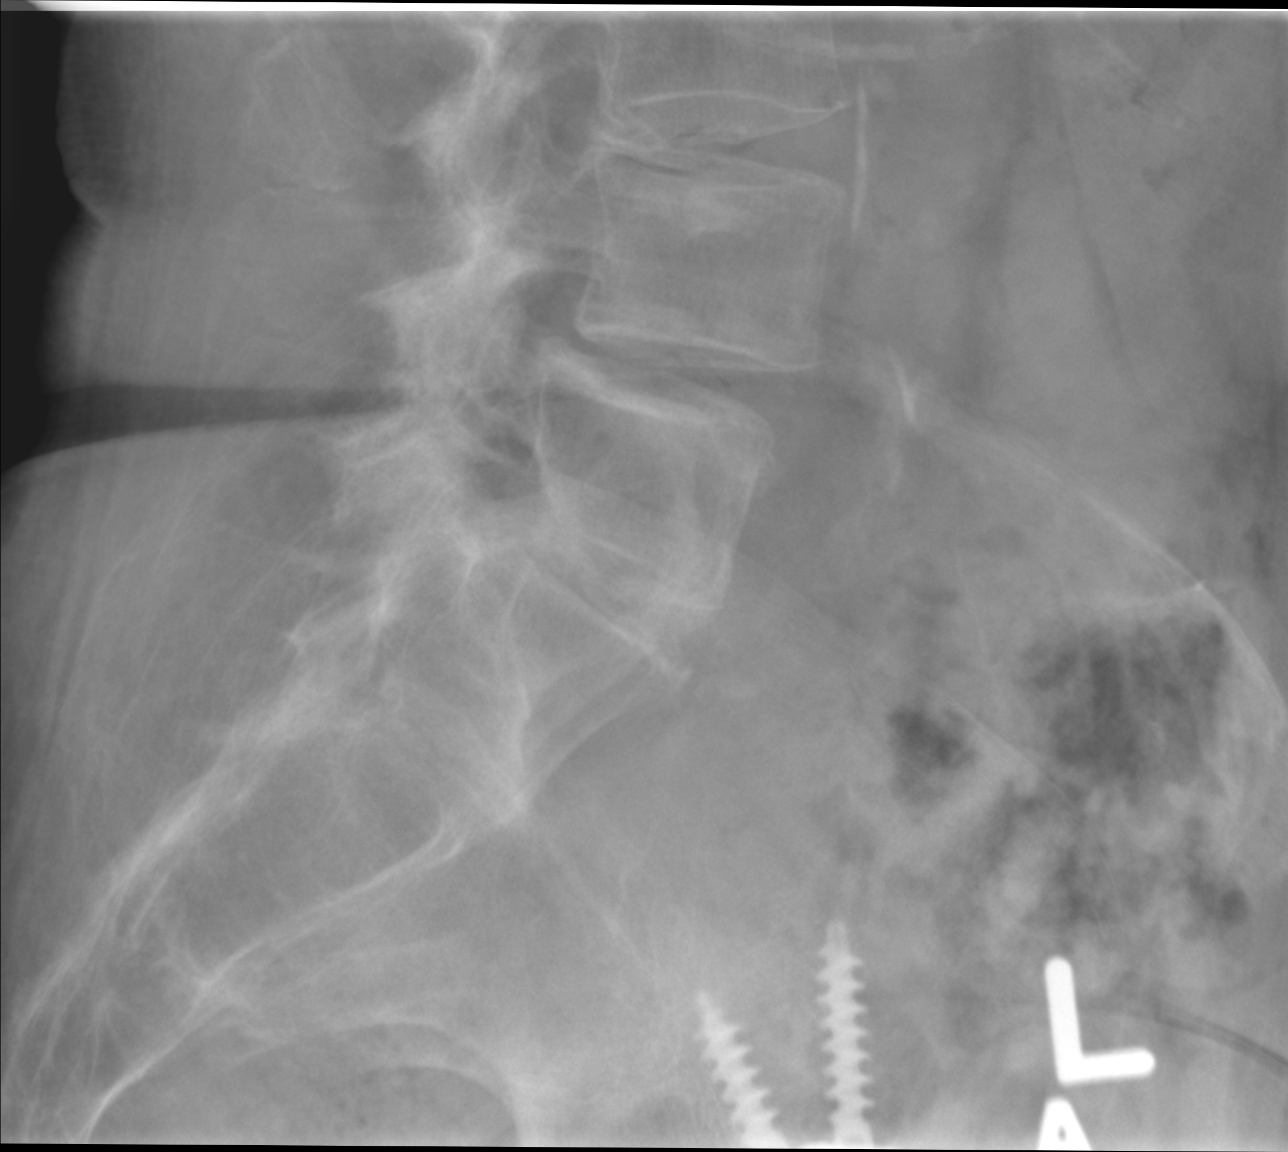

[5 of 5 positions shown; findings below may reference images not displayed]

EXAM

Lumbar spine

INDICATION

back pain
Lower back pain.

FINDINGS

Five views of the lumbar spine were obtained, including obliques.

Lumbar vertebral body heights are normal.

There is mild grade 1 spondylolisthesis of L4 on L5.

There is mild disc space narrowing at L4-5 and L5-S1.

There are osteophytes of vertebral body endplates of L1 through L5.

There is moderate narrowing of the facet joint spaces bilaterally at L3-4, L4-5 and L5-S1.

IMPRESSION

There is multilevel lumbar spine spondylosis. There is no acute abnormality. There is mild grade 1
spondylolisthesis of L4 on L5 secondary to facet arthrosis.

Tech Notes:

Lower back pain.

## 2021-06-23 IMAGING — MR L-spine^Routine
4 series · 38 of 48 positions shown · non-contrast
Comparison: none

[Series 2: T2 · sagittal · 4.0mm · 0.62mm/px · 7 of 13 slices shown (1 of 2)]
[im 1/13]
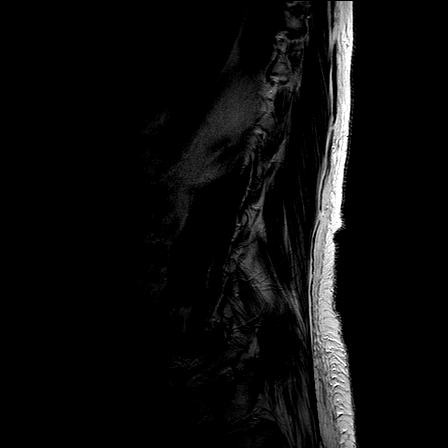
[im 3/13]
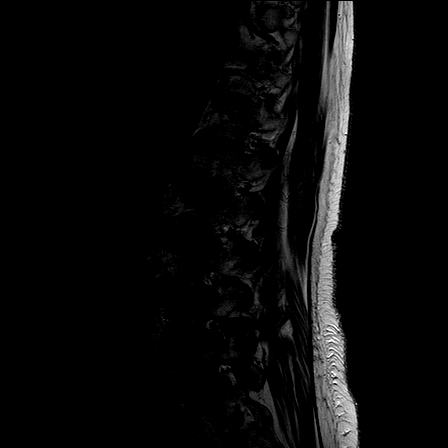
[im 5/13]
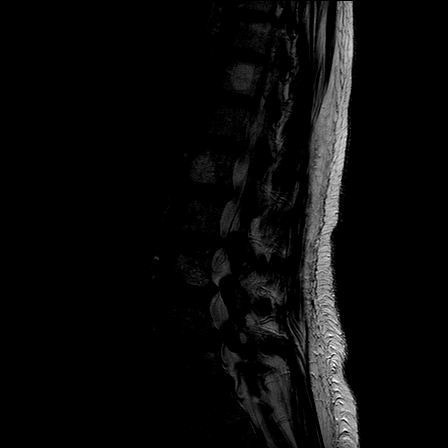
[im 7/13]
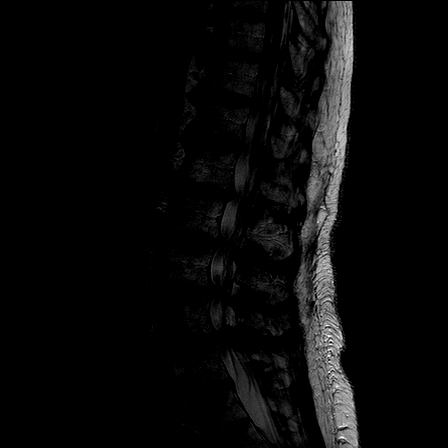
[im 9/13]
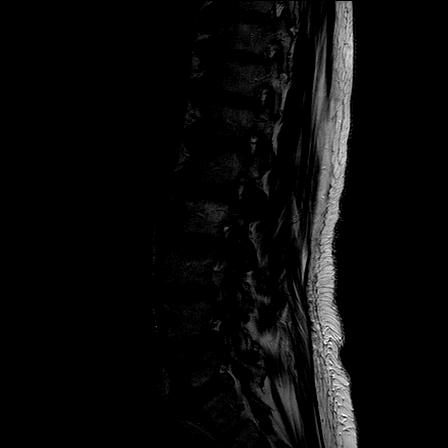
[im 11/13]
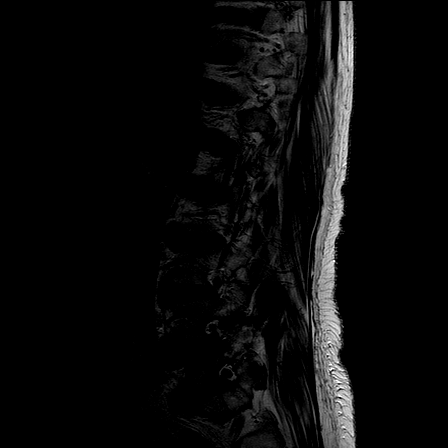
[im 13/13]
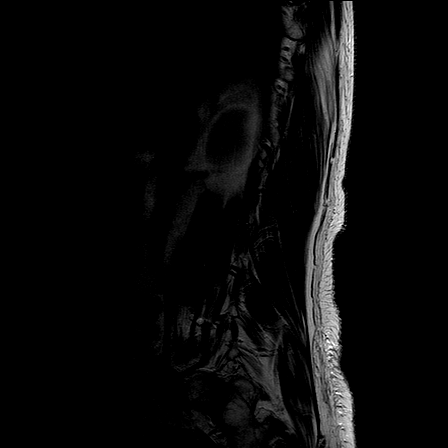

[Series 3: T1 · sagittal · 4.0mm · 0.73mm/px · 7 of 13 slices shown (1 of 2)]
[im 1/13]
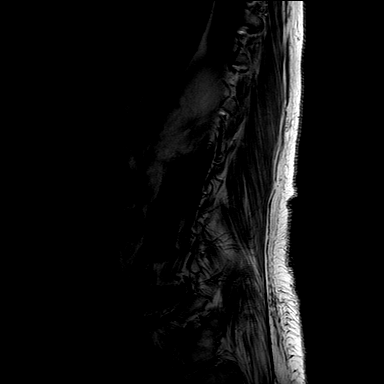
[im 3/13]
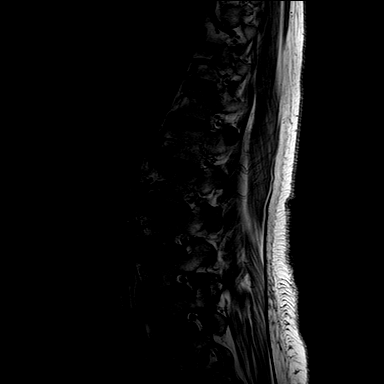
[im 5/13]
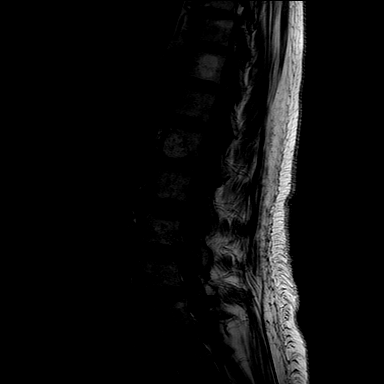
[im 7/13]
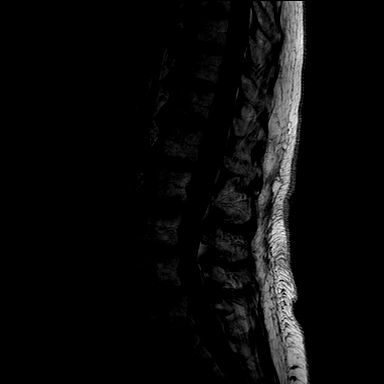
[im 9/13]
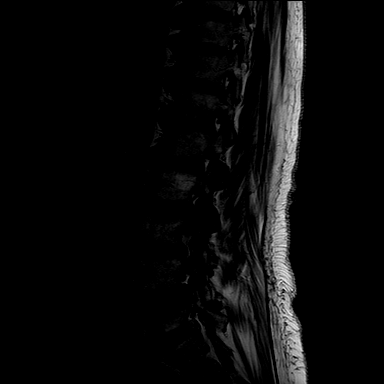
[im 11/13]
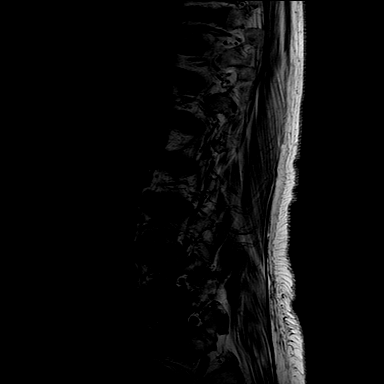
[im 13/13]
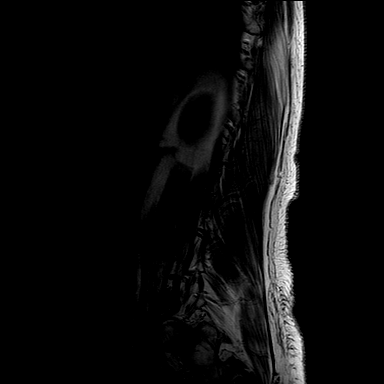

[Series 5: T2 · axial · 4.5mm · 0.49mm/px · z∈[-140,+24]mm · 13 of 29 slices shown (2 of 2)]
[im 1/29]
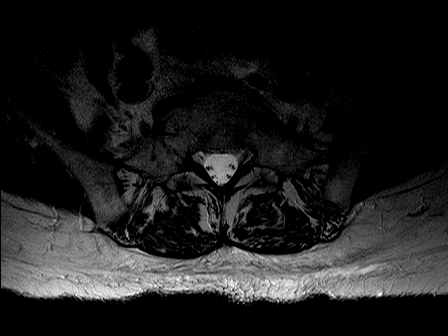
[im 2/29]
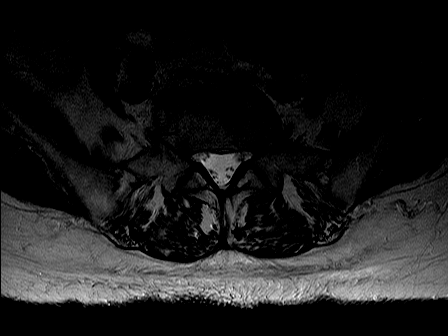
[im 4/29]
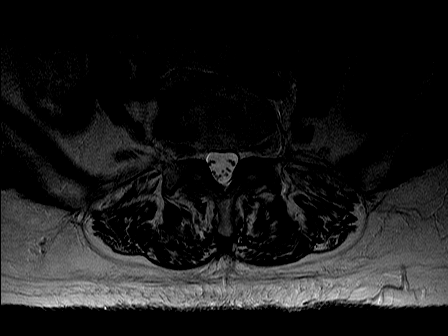
[im 6/29]
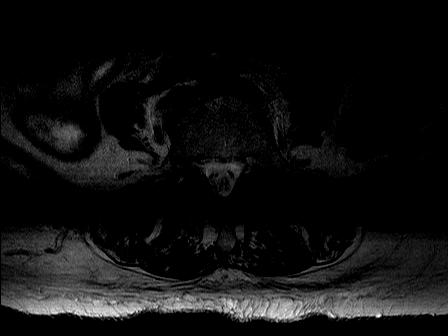
[im 8/29]
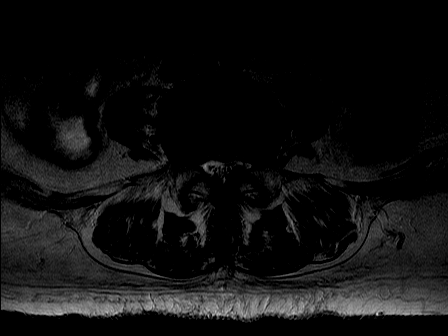
[im 9/29]
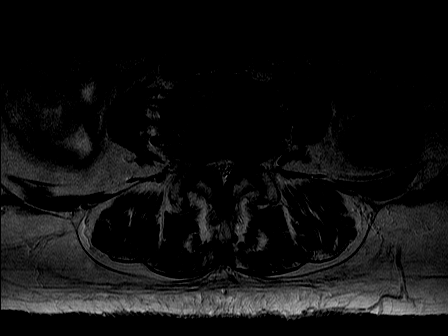
[im 13/29]
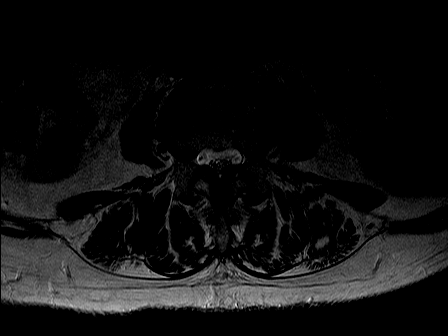
[im 15/29]
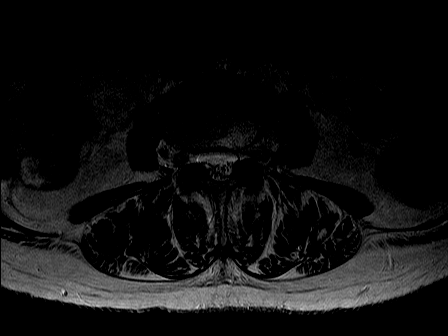
[im 16/29]
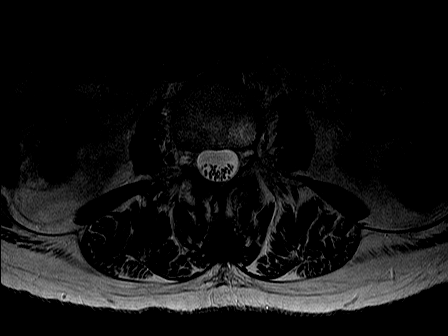
[im 20/29]
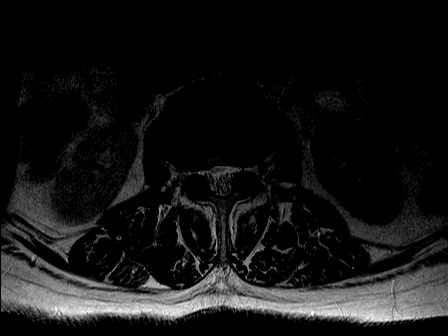
[im 23/29]
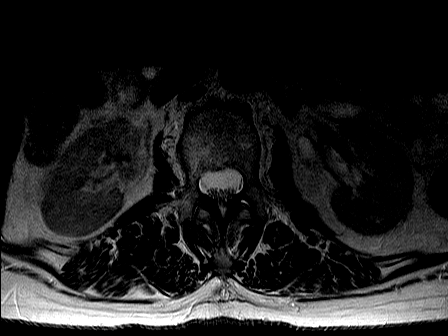
[im 25/29]
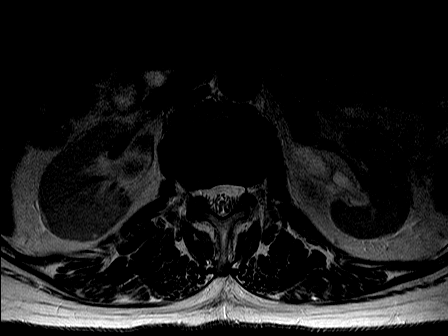
[im 27/29]
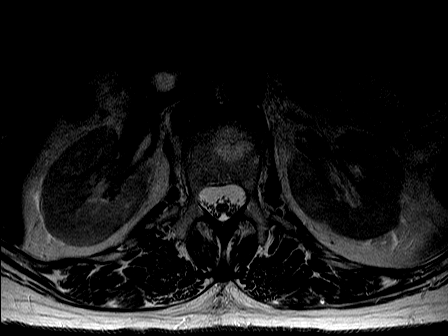

[Series 6: T1 · axial · 4.5mm · 0.86mm/px · z∈[-135,+24]mm · 11 of 29 slices shown (2 of 2)]
[im 2/29]
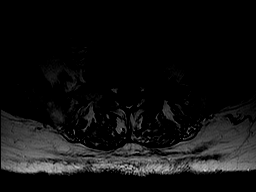
[im 4/29]
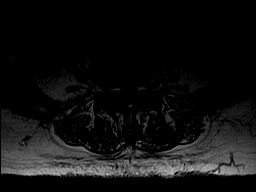
[im 6/29]
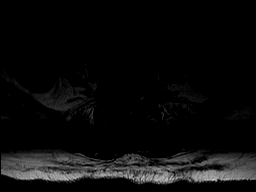
[im 9/29]
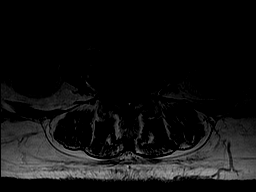
[im 13/29]
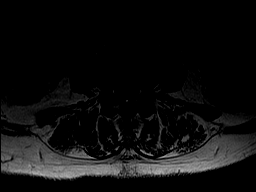
[im 15/29]
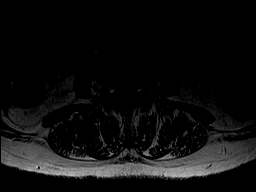
[im 16/29]
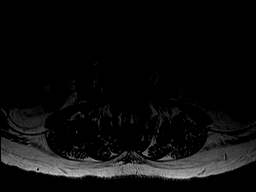
[im 20/29]
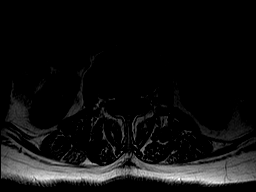
[im 23/29]
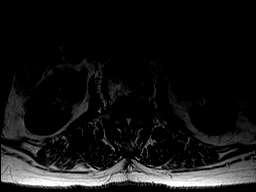
[im 25/29]
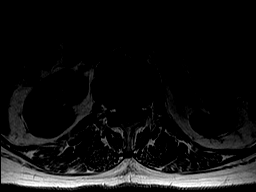
[im 27/29]
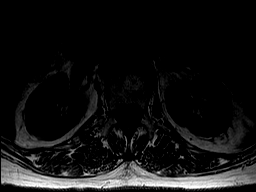

[38 of 48 positions shown; findings below may reference images not displayed]

EXAM

MR lumbar spine wo con

INDICATION

back pain
CHRONIC WORSENING LOW BACK PAIN WITH RADIATION.  RG

TECHNIQUE

MR lumbar spine wo con

COMPARISONS

CT March 31, 2020

FINDINGS

Conus medullaris tip terminates at L1-L2. Normal cord and marrow signal.

L1-L2: Small disc bulge with minimal central canal narrowing. Mild bilateral neural foraminal
narrowing.

L2-L3: Small disc bulge and facet osteoarthritis with mild central canal narrowing. Mild bilateral
neural foraminal narrowing. Similar chronic mild compression of L3.

L3-L4: Small disc bulge with moderate central canal narrowing. Mild bilateral neural foraminal
narrowing.

L4-L5: Small disc bulge and facet osteoarthritis with moderate central canal narrowing. Mild to
moderate bilateral neural foraminal narrowing.

L5-S1: Mild left neural foraminal narrowing. Patent central canal.

Left renal cysts.

IMPRESSION

Multilevel degenerative disc disease with multiple disc bulges. Degenerative changes are greatest
at L4-L5 where there is moderate central canal and mild-to-moderate neural foraminal narrowing.

Note: The following findings are so common in people without low back pain that while we report
their presence, they must be interpreted with caution and in the context of the clinical situation.
(Reference --Jarvik et al, Spine 2557)

Findings (prevalence in patients without low back pain)

Disc degeneration (decreased T2 signal, height loss, bulge) (91%)

Disc T2 -- signal loss (83%)

Disc height loss (56%)

Disc bulge (64%)

Disc protrusion (32%)

Annular tear (38%)

Tech Notes:

CHRONIC WORSENING LOW BACK PAIN WITH RADIATION.  RG

## 2021-07-14 IMAGING — RF FL guided spine inject
1 series · 1 of 1 positions shown · non-contrast
Comparison: none

[Series 1: run · 1 of 1 slices shown]
[im 1/1]
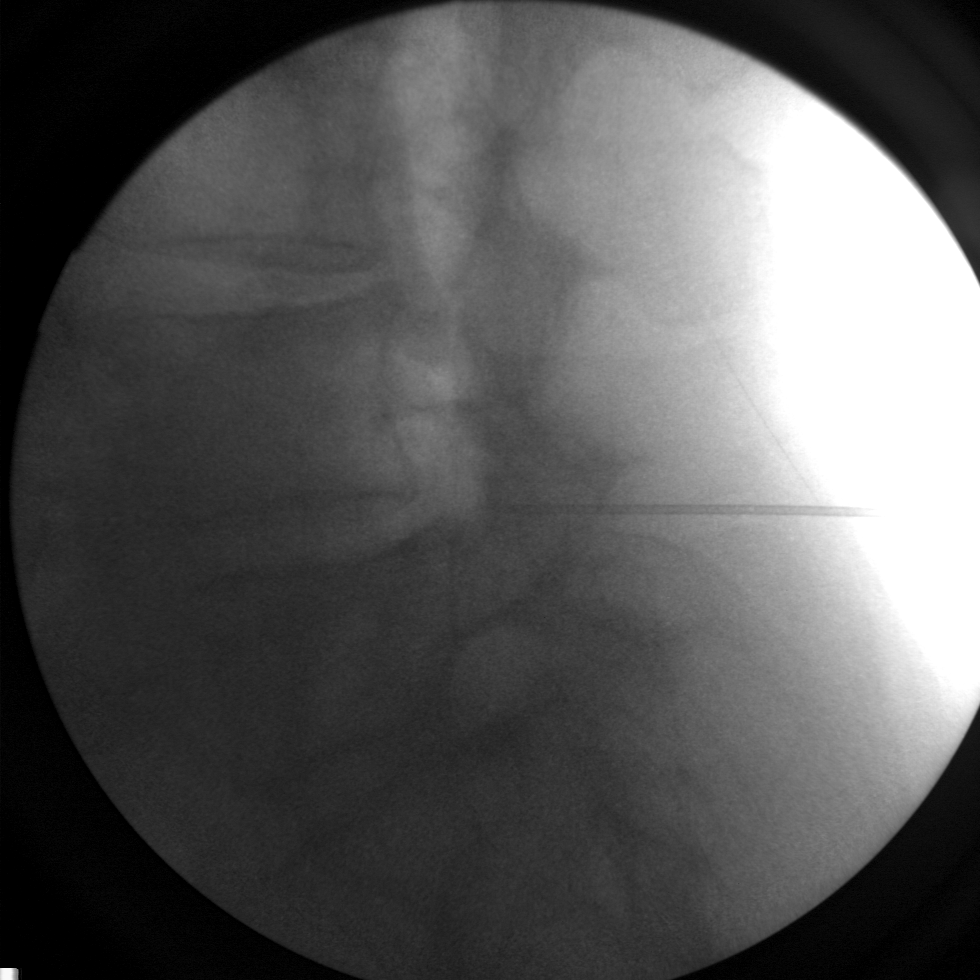

[1 of 1 positions shown; findings below may reference images not displayed]

DIAGNOSTIC STUDIES

EXAM

Fluoroscopic guidance for spinal injection

INDICATION

PUVACA

TECHNIQUE

Fluoro time is 19.1 seconds. 1 spot film was obtained.

COMPARISONS

None available

FINDINGS

Please see procedure note for full details. Fluoroscopic guidance is provided for spinal injection.

IMPRESSION

Fluoroscopic guidance for spinal injection.

Tech Notes:

PUVACA. 1 FLUORO IMAGE. 19 SECONDS. 2.99 mGy. TJ

## 2022-01-12 IMAGING — RF FL guided spine inject
1 series · 7 of 7 positions shown · non-contrast
Comparison: none

[Series 1: run · 4 acquisitions, 7 frames shown]
[im 1/4]
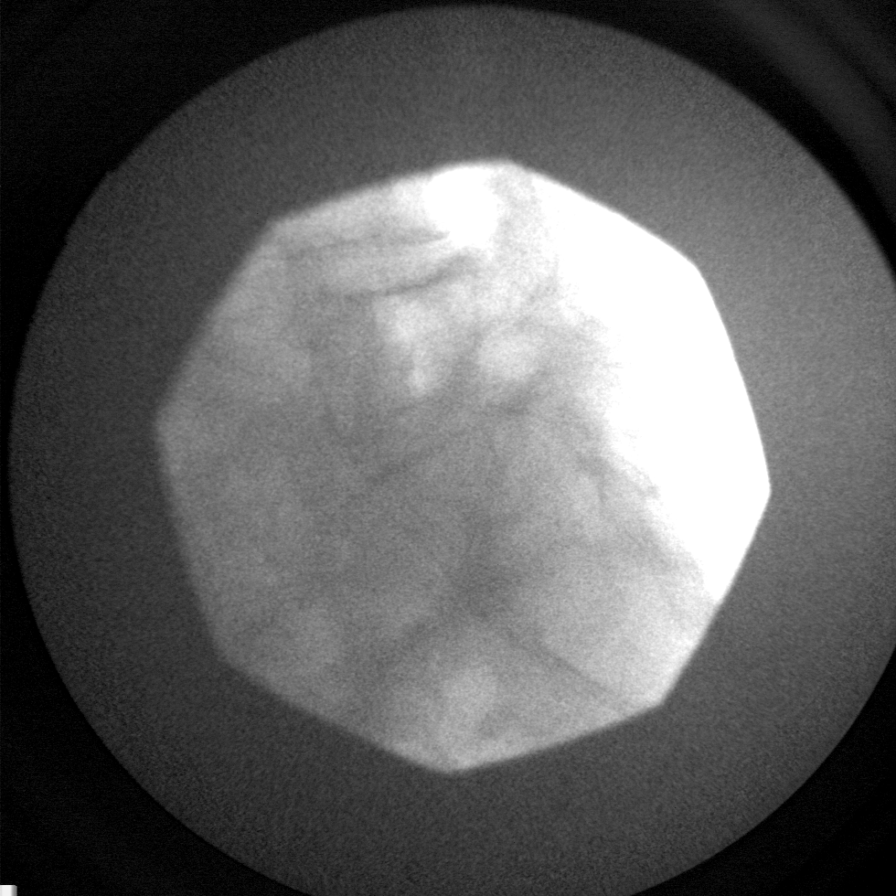
[im 2/4]
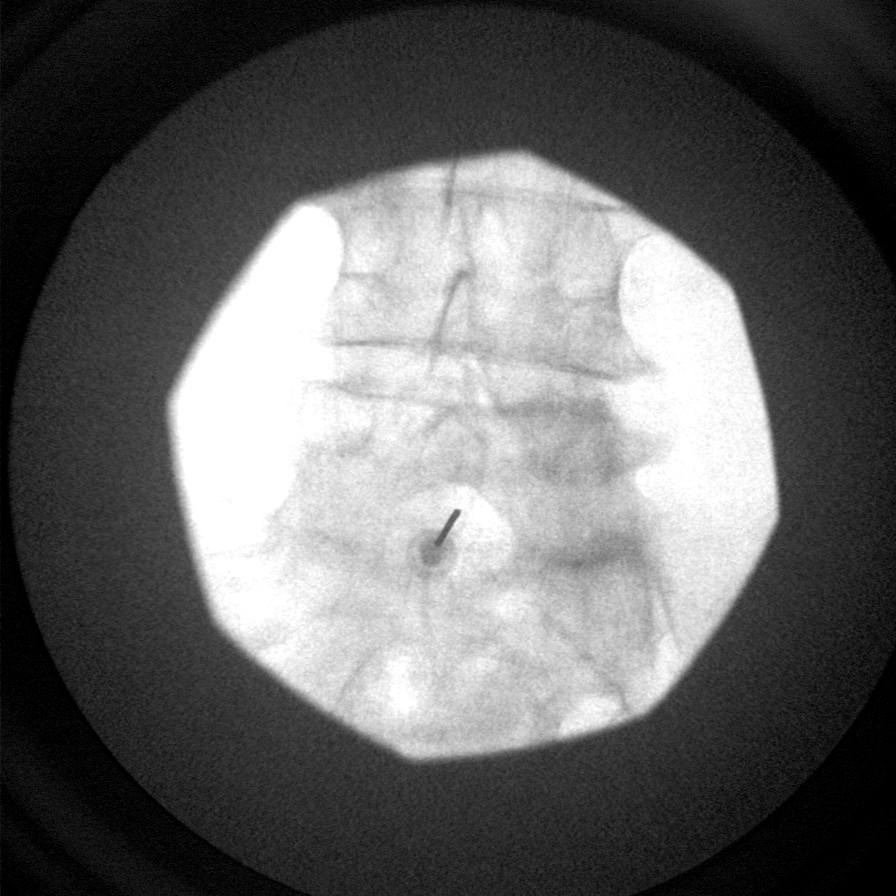
[im 3/4]
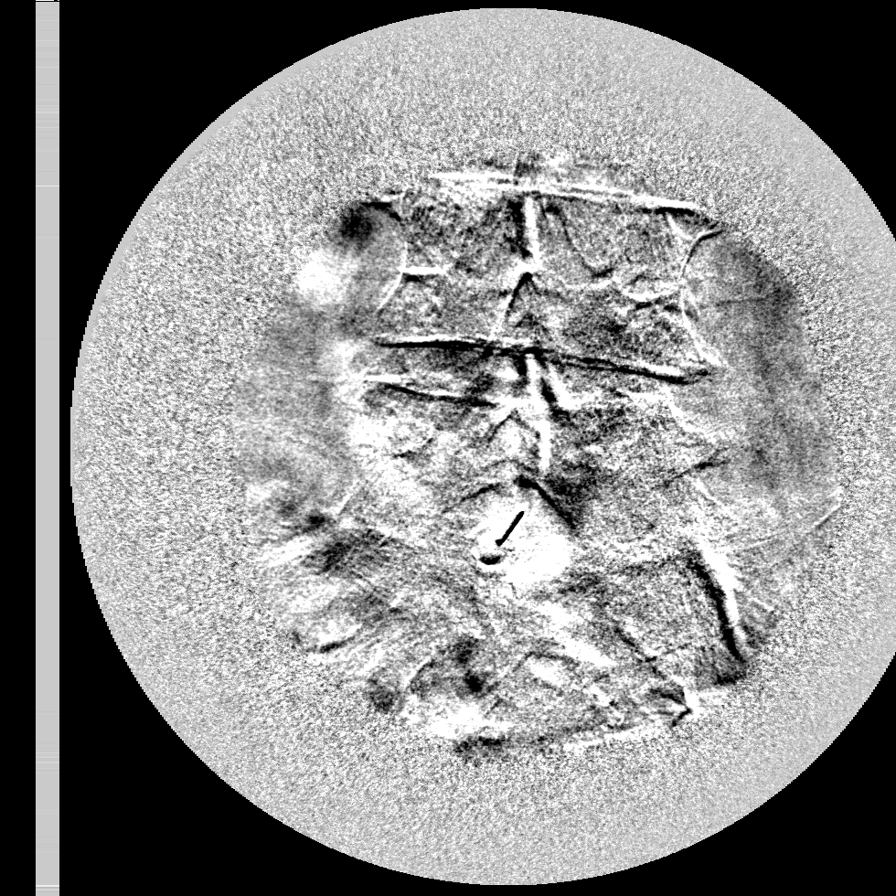
[im 3/4]
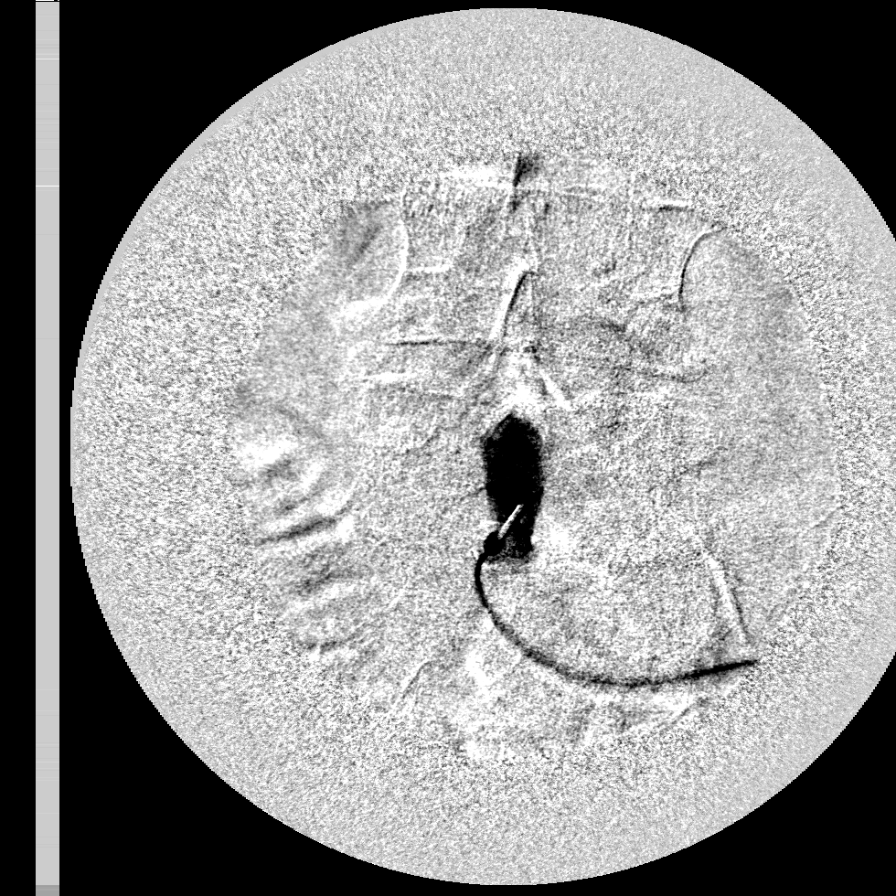
[im 3/4]
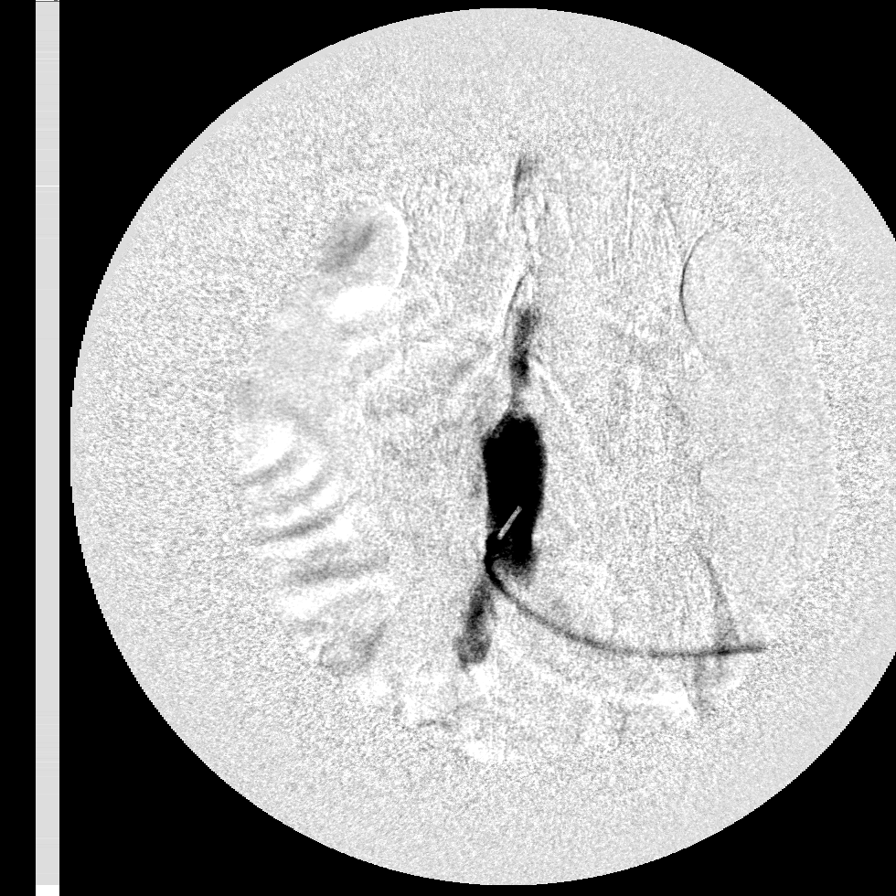
[im 3/4]
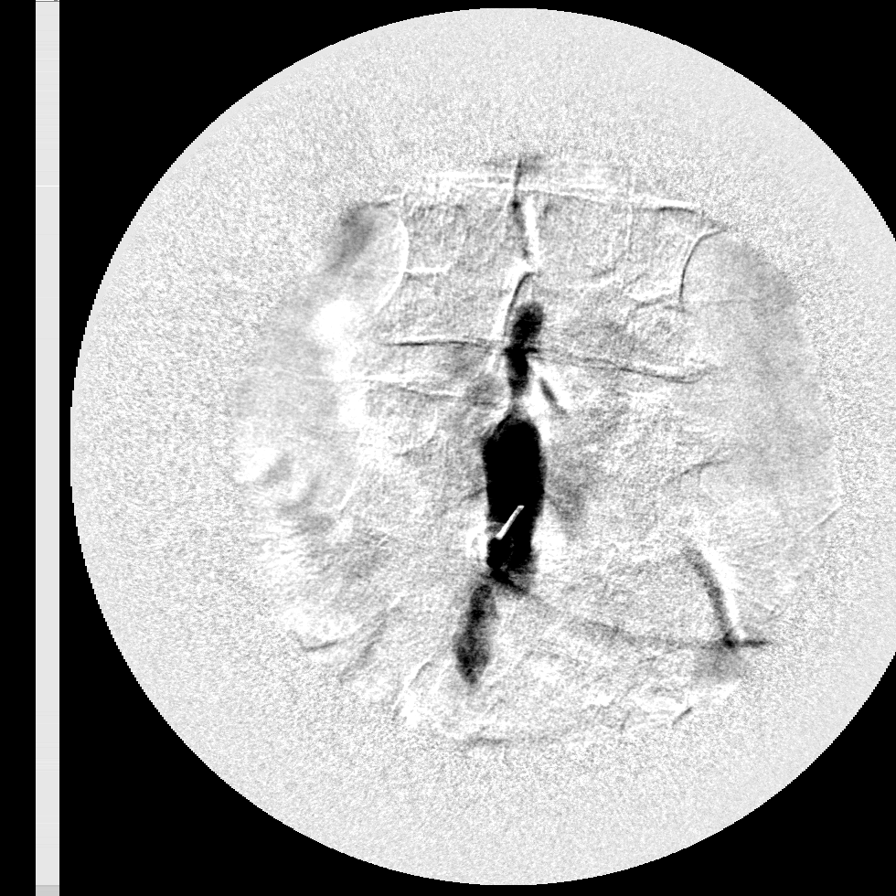
[im 4/4]
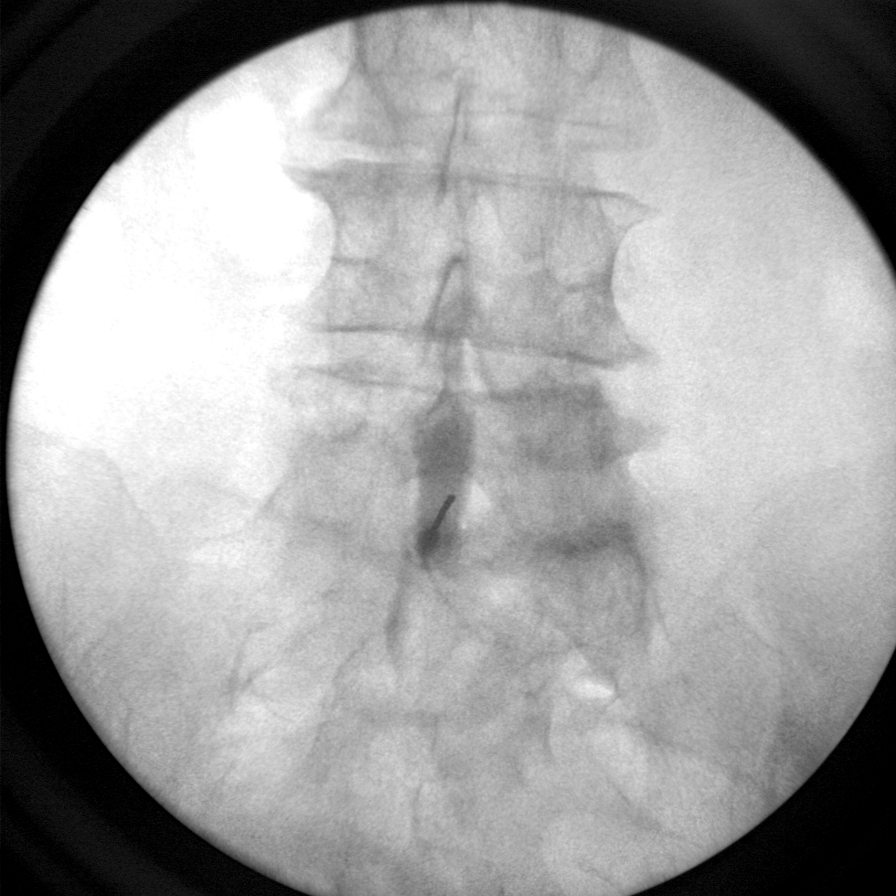

[7 of 7 positions shown; findings below may reference images not displayed]

DIAGNOSTIC STUDIES

EXAM

Fluoroscopic guidance for spinal injection.

INDICATION

ARCEO
ARCEO. OR 3 Dr Lumbreras, fluoro time 19.8secs, 4.16 mGy 4img. CS

TECHNIQUE

Fluoro time is 18.6 seconds. Cine clip and 3 spot films were obtained.

COMPARISONS

July 21, 2021

FINDINGS

Please see procedure note for full details. Fluoroscopic guidance was provided for spinal
injection.

IMPRESSION

Fluoroscopic guidance for spinal injection.

Tech Notes:

ARCEO. OR 3 Dr Lumbreras, fluoro time 19.8secs, 4.16 mGy 4img. CS

## 2022-02-21 ENCOUNTER — Encounter: Admit: 2022-02-21 | Discharge: 2022-02-21 | Payer: MEDICARE

## 2022-02-21 NOTE — Telephone Encounter
02-21-2022 Per Task Message, request faxed to Dr Lona Kettle , (F) [phone]  Ronald Pippins, requested records go to [employer], clp      PCP- Ozzie Hoyle, [facility], ph: [phone]

## 2022-03-01 ENCOUNTER — Encounter: Admit: 2022-03-01 | Discharge: 2022-03-01 | Payer: MEDICARE

## 2022-03-03 ENCOUNTER — Encounter: Admit: 2022-03-03 | Discharge: 2022-03-03 | Payer: MEDICARE

## 2022-03-06 ENCOUNTER — Encounter: Admit: 2022-03-06 | Discharge: 2022-03-06 | Payer: MEDICARE

## 2022-03-14 ENCOUNTER — Encounter: Admit: 2022-03-14 | Discharge: 2022-03-14 | Payer: MEDICARE

## 2022-04-03 IMAGING — CR SHOULDCMLT
4 series · 4 of 4 positions shown · non-contrast
Comparison: none

[shoulder external]
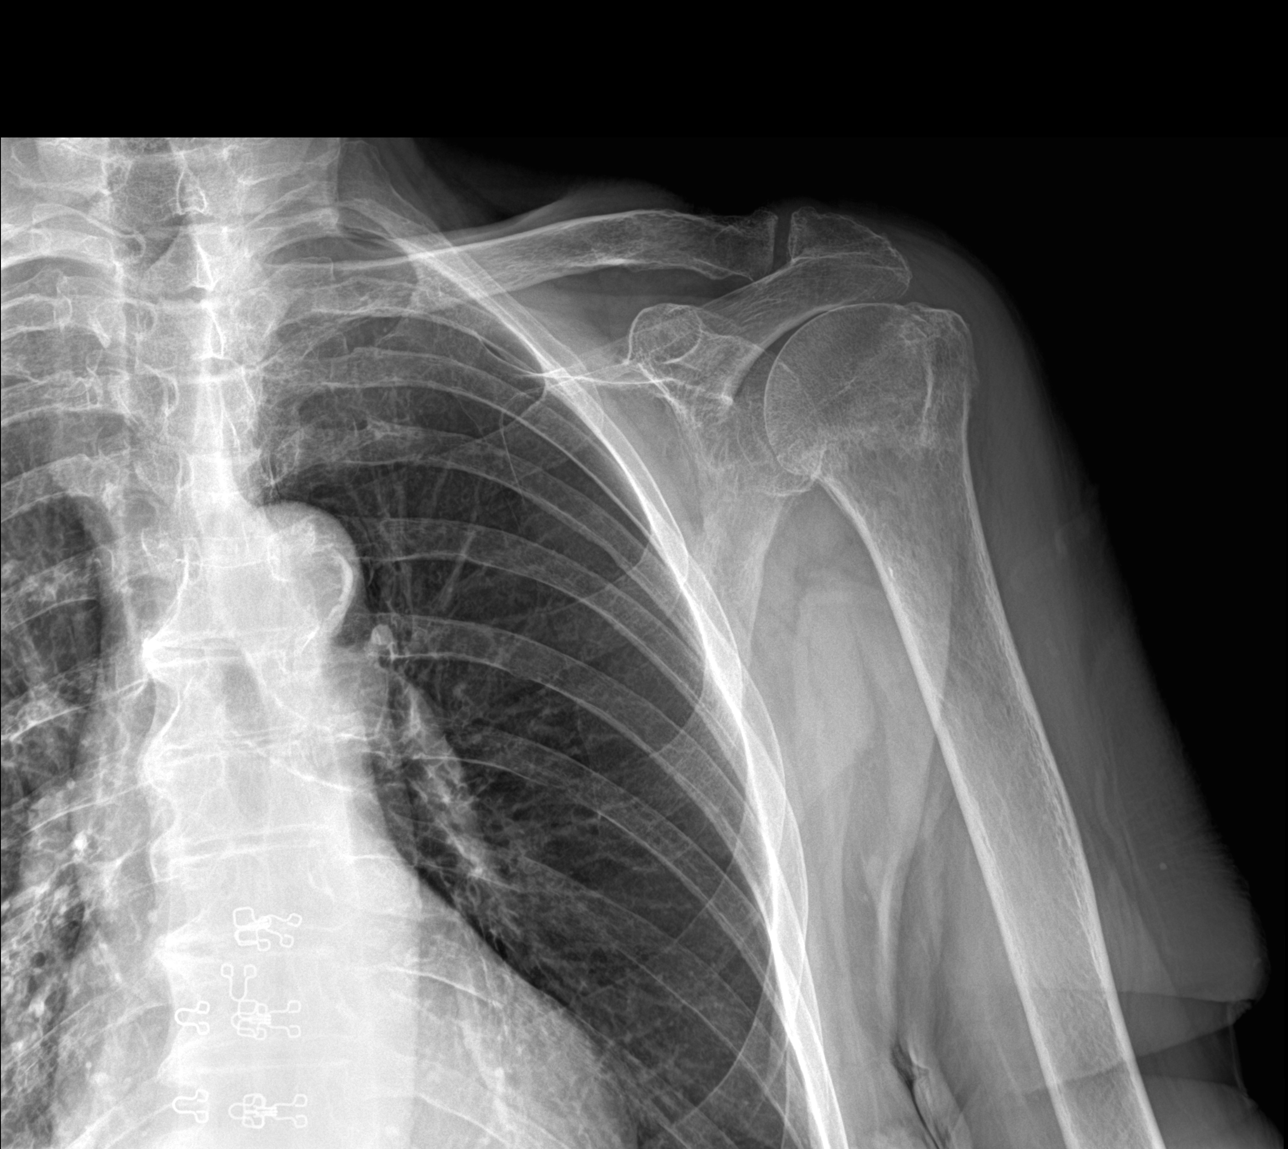

[shoulder internal]
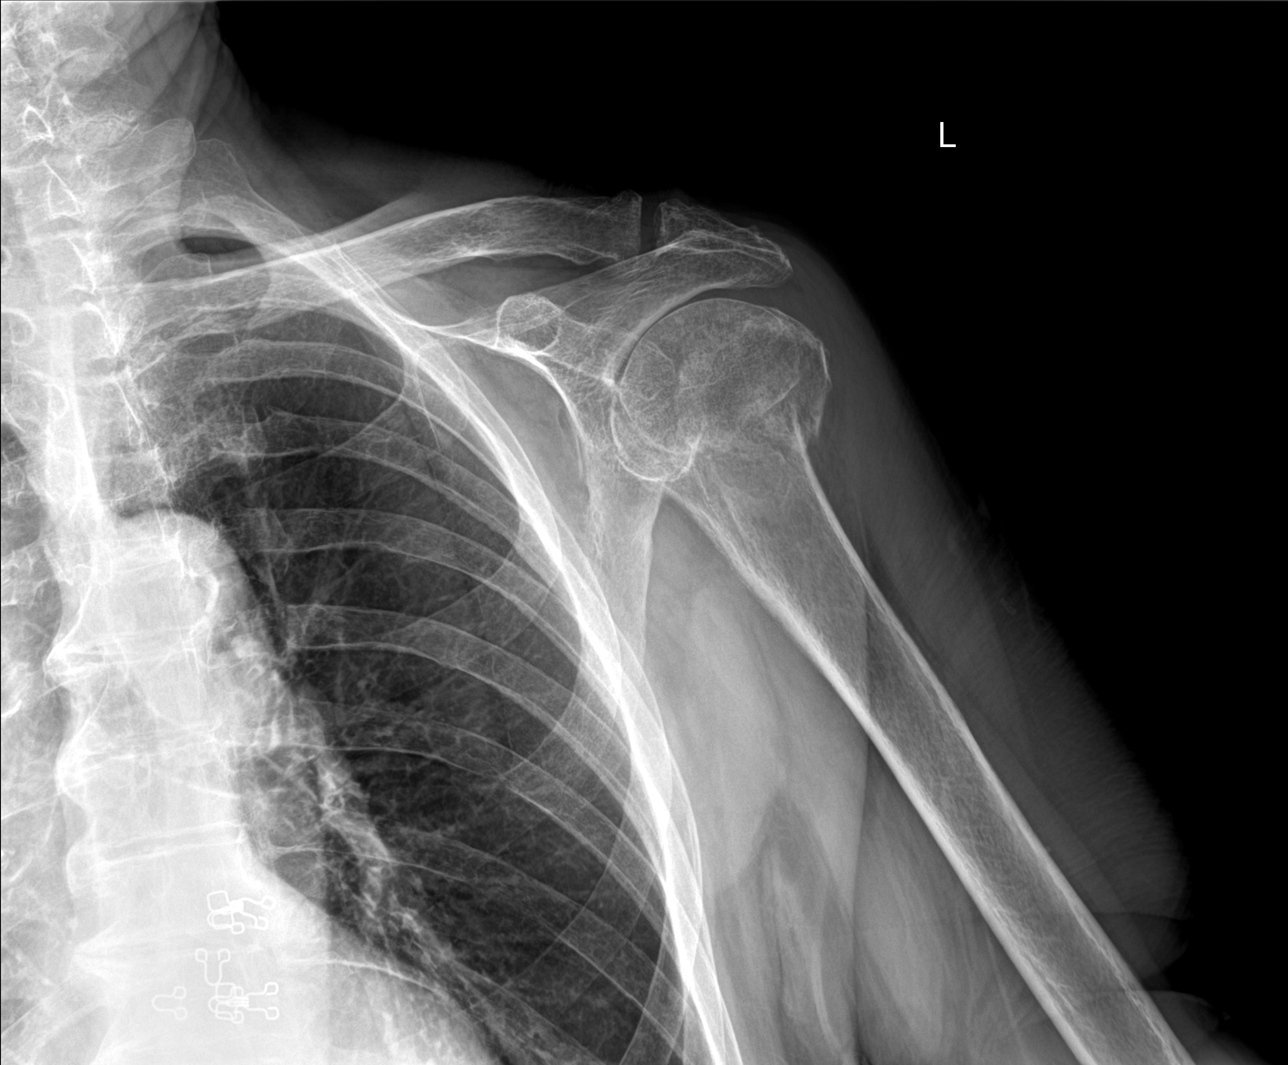

[shoulder y-view]
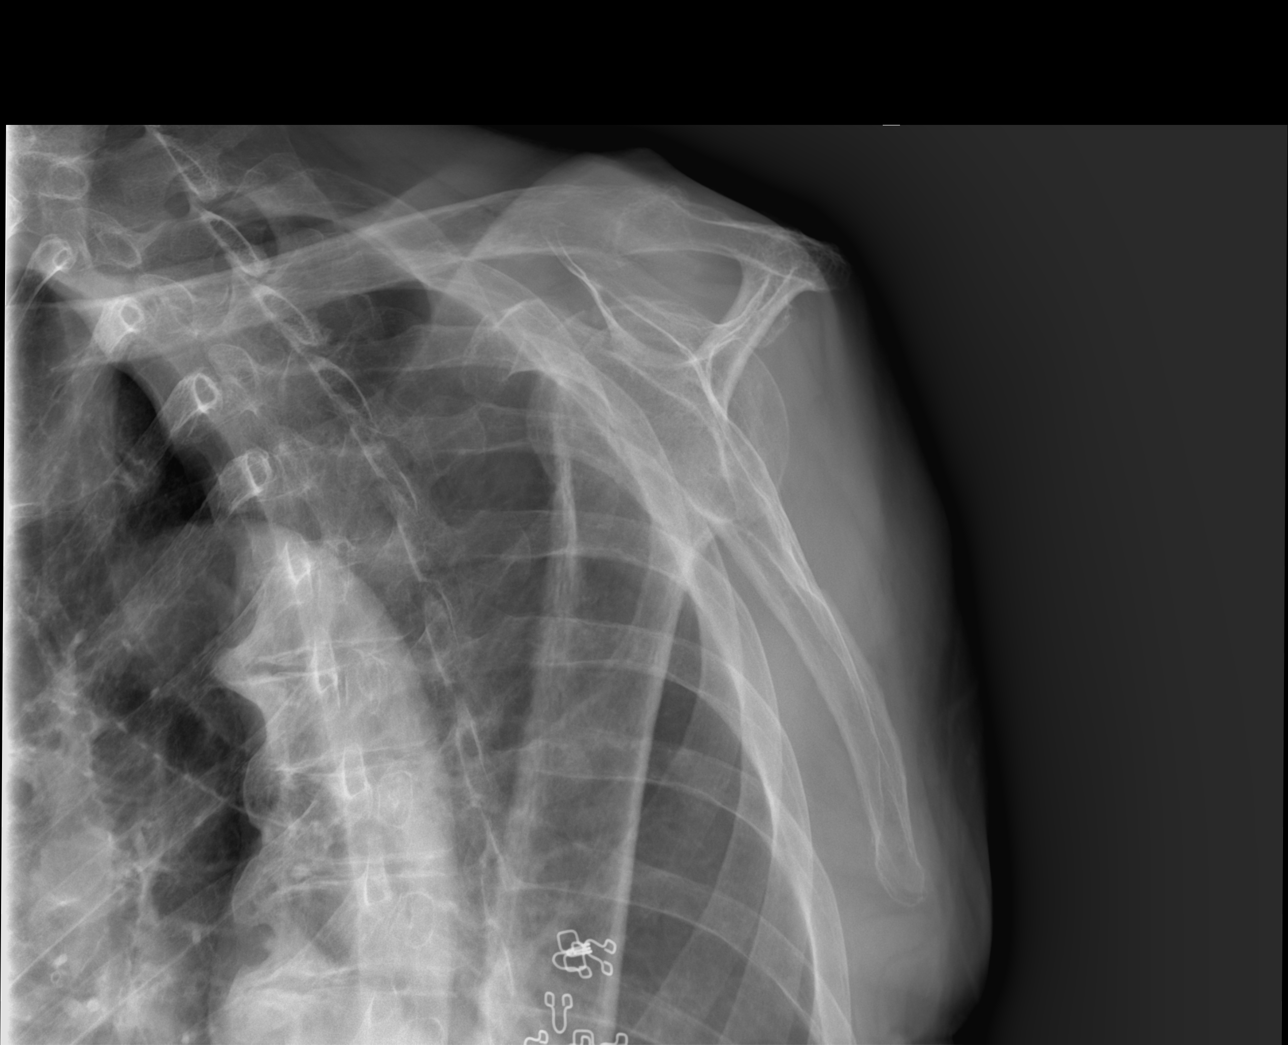

[shoulder axillary]
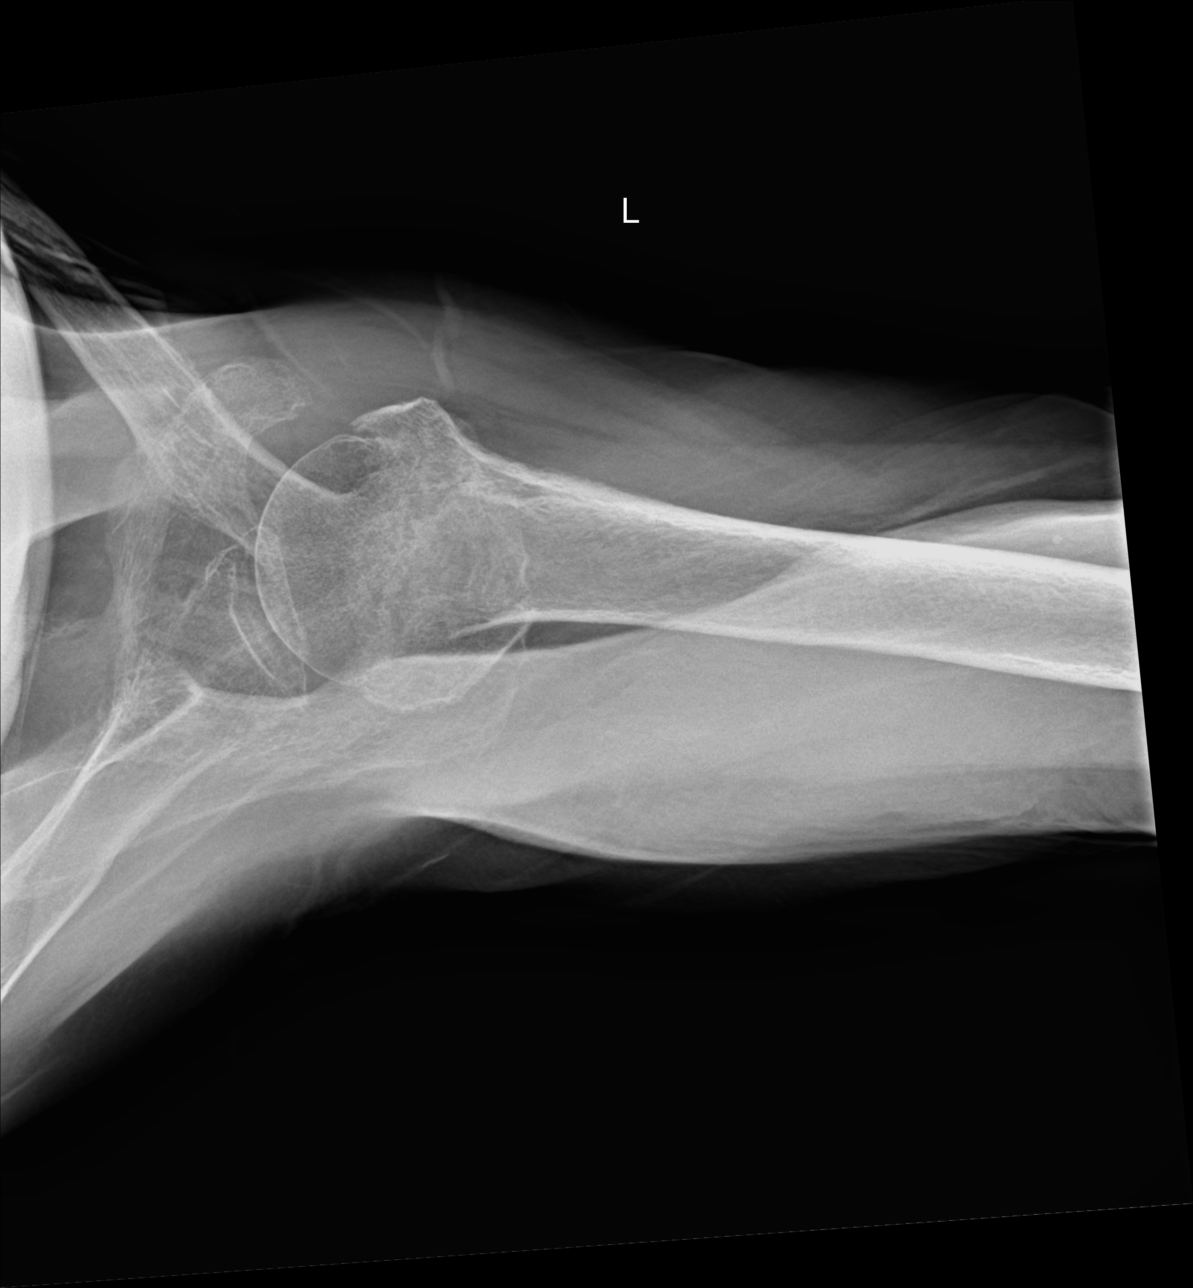

[4 of 4 positions shown; findings below may reference images not displayed]

DIAGNOSTIC STUDIES

EXAM

XR shoulder left, complete

INDICATION

f/u lt shoulder fx
FU LEFT SHOULDER FX 02/15/22 RG

TECHNIQUE

Internal external rotated views lateral views and axillary views

COMPARISONS

March 06, 2022

FINDINGS

Humeral neck fracture involving greater tuberosity is re-demonstrated without change in appearance
or alignment. Slight progressive callus formation is noted.

IMPRESSION

Healing humeral fracture. No new fractures are seen.

Tech Notes:

FU LEFT SHOULDER FX 02/15/22  RG

## 2024-10-14 ENCOUNTER — Encounter: Admit: 2024-10-14 | Discharge: 2024-10-14 | Payer: MEDICARE

## 2024-10-20 ENCOUNTER — Encounter: Admit: 2024-10-20 | Discharge: 2024-10-20 | Payer: MEDICARE

## 2024-10-20 NOTE — Telephone Encounter [36]
 10/20/2024 Records received from the office of Dr. Ethel Bruns with referral have been saved to the consolidated folder until appointment is made or 90days.No referral telephone encounter to index records to at this time.  EAK

## 2024-10-22 ENCOUNTER — Encounter: Admit: 2024-10-22 | Discharge: 2024-10-22 | Payer: MEDICARE

## 2024-10-22 NOTE — Telephone Encounter [36]
 External records pending, documenting communication for medical records to attach records to chart. Please allow 24-48 hours for records if pending for navigation.

## 2024-10-23 ENCOUNTER — Ambulatory Visit: Admit: 2024-10-23 | Discharge: 2024-10-23 | Payer: MEDICARE

## 2024-10-23 ENCOUNTER — Encounter: Admit: 2024-10-23 | Discharge: 2024-10-23 | Payer: MEDICARE

## 2024-11-06 ENCOUNTER — Encounter: Admit: 2024-11-06 | Discharge: 2024-11-06 | Payer: MEDICARE

## 2024-11-07 ENCOUNTER — Encounter: Admit: 2024-11-07 | Discharge: 2024-11-07 | Payer: MEDICARE

## 2024-11-25 ENCOUNTER — Encounter: Admit: 2024-11-25 | Discharge: 2024-11-25 | Payer: MEDICARE

## 2024-12-03 ENCOUNTER — Encounter: Admit: 2024-12-03 | Discharge: 2024-12-03 | Payer: MEDICARE

## 2024-12-03 VITALS — BP 171/86 | HR 53 | Ht 61.0 in | Wt 161.2 lb

## 2024-12-03 DIAGNOSIS — Z136 Encounter for screening for cardiovascular disorders: Principal | ICD-10-CM

## 2024-12-03 DIAGNOSIS — I2510 Atherosclerotic heart disease of native coronary artery without angina pectoris: Secondary | ICD-10-CM

## 2024-12-03 DIAGNOSIS — R931 Abnormal findings on diagnostic imaging of heart and coronary circulation: Secondary | ICD-10-CM

## 2024-12-03 DIAGNOSIS — I10 Essential (primary) hypertension: Secondary | ICD-10-CM

## 2024-12-03 DIAGNOSIS — E785 Hyperlipidemia, unspecified: Secondary | ICD-10-CM

## 2024-12-03 DIAGNOSIS — E782 Mixed hyperlipidemia: Secondary | ICD-10-CM

## 2024-12-03 MED ORDER — ROSUVASTATIN 20 MG PO TAB
20 mg | ORAL_TABLET | Freq: Every day | ORAL | 3 refills | 90.00000 days | Status: AC
Start: 2024-12-03 — End: ?

## 2024-12-03 MED ORDER — AMLODIPINE 5 MG PO TAB
5 mg | ORAL_TABLET | Freq: Every day | ORAL | 3 refills | 90.00000 days | Status: AC
Start: 2024-12-03 — End: ?

## 2024-12-03 NOTE — Patient Instructions [37]
 Crestor  20mg  daily  Amlodipine  5mg  daily  Labs in 3 months  Follow up in 1year    Follow up as directed.  Call sooner if issues.  Call the [place] nursing line at [phone].  Leave a detailed message for the nurse in [place] [facility] with how we can assist you and we will call you back.

## 2024-12-03 NOTE — Progress Notes [1]
 CARDIOLOGY CONSULT NOTE    Reason for Consult:    Abnormal calcium score  Atrial tachycardia    History of Present Illness    [name] is a very pleasant 83 year old woman from [place], [place] .  She is a very active 83 year old who continues to do multiple chores around her house.  She has a history of type 2 diabetes mellitus and she was diagnosed more than a decade prior.  In the year 2015, she underwent a gastric bypass and lost a significant amount of weight following which her A1c normalized.  Her husband passed away approximately 3 years back.  At [facility], he was noted to have 6 different coronary stenosis.  Unfortunately, he succumbed to coronary artery disease.  The patient was quite concerned about her own health and subsequently underwent a calcium score the results of which demonstrated 920 Agatston units.  The precise distribution is listed below.  She underwent a pharmacological nuclear stress test which was negative for ischemia.  Additionally, she also underwent a Holter monitor for heart palpitations in the night which are not lifestyle-limiting in nature.  She was noted to have 3 atrial tachycardia episodes lasting for a maximum of 21 beats, average of 130 bpm.  PAC, PVC burden was less than 1%.  No atrial fibrillation was noted.  The patient reports no angina, heart failure symptoms.  No history of PND, orthopnea, syncope or near syncope or sudden cardiac arrest.  No prior history of cardiac catheterization.  No major bleeding problems.  No evidence of thyroid dysfunction.  No recent electrolyte aberrations.  No recent medication changes.  No prior myocardial infarction's, PCI or bypass grafting.  No prior cardiac catheterization.                10/23/2024 - Regadenoson MPI at [facility] - This study is probably normal with no evidence of significant myocardial ischemia. Left ventricular systolic function is normal. There are no high risk prognostic indicators present. The pharmacologic ECG portion of the study is negative for ischemia.   10/23/2024 - Echocardiogram at [facility] -  The left ventricular size, wall thickness and systolic function are normal. The visually estimated ejection fraction is 65%. There are no segmental wall motion abnormalities.The right ventricular size is normal. The right ventricular systolic function is normal. Normal biatrial size. Tricuspid Valve: Normal valve structure. No stenosis. Mild to moderate regurgitation. Estimated Peak Systolic PA Pressure 39 mmHg.  Normal estimated CVP. No pericardial effusion.  10/13/2024 - CT Calcium Score at [facility] - Total Calcium Score 920.6; LAD 373.6; LCX 346.3; RCA 200.6  10/02/2024 - Holter Monitor at [facility] - Predominant rhythm: NSR; Atrial Tachycardia 3 episodes, Longest/Fastest 21 beats @ Avg 117 bpm up to 133 bpm; Ectopic Atrial Run(s); PAC 0.5%, PVC 0.9%    12 lead EKG this visit: Sinus bradycardia, sinus arrhythmia noted.  No EKG changes suggestive of ischemia.      Assessment & Recs     Assessment:    Elevated coronary artery calcium score  Abnormal Holter monitor  Mixed hyperlipidemia  Benign essential hypertension    Plan:    1.  Patient reports no significant evidence of angina or heart failure and her functional tolerance remains excellent.  She has multitude of atherosclerotic risk factors such as age above 9, hypertension which is uncontrolled and mixed hyperlipidemia as well as a severely elevated coronary artery calcification score of 920 Agatston units.  We explained the pathophysiology of an elevated  coronary artery calcium score and also its prognostic significance especially since she has a high risk score.  Fortunately, her pharmacological stress test showed no evidence of ischemia.  Additionally, her echo shows preserved LVEF, no wall motion abnormality or valvular dysfunction.  In the absence of angina or clinical heart failure, will defer any further testing at this juncture  2.  Recommend aggressive lipid control with target LDL less than 55 mg/dL, currently 888 mg/dL.  3.  Recommend starting Crestor  20 nightly.  Repeat fasting lipid profile in 3 months and double up dosing if necessary.  4.  Blood pressure reading assessment, 3 times per week with adequate technique advised to the patient.  Target less than 130/80 mmHg  5.  Recommend amlodipine  5 mg once daily.  6.  She states that her heart palpitations are clearly not lifestyle-limiting in nature and she has underlying sinus bradycardia which would preclude use of AV nodal blockers.  She has a very minimal burden of PACs and atrial tachycardia.  We decided against medical therapy or antiarrhythmic drug therapy at this juncture unless she has persistent symptoms.  No syncopal episodes noted.  No A-fib noted on her monitor.  7.  Clinic follow-up with me in 1 year recommended.    Thank you for allowing us  to participate in the care of this patient.     Amelie Boots, MD, [facility]  Interventional Cardiologist    Current medications and allergies reviewed    Past Medical History  Past Medical History:    Arthritis    CAD (coronary artery disease)    Chest pain    Diabetes (CMS-HCC)    HTN (hypertension)    Hyperlipemia       Past Surgical History  Surgical History:   Procedure Laterality Date    CARDIOVASCULAR STRESS TEST      ECHOCARDIOGRAM PROCEDURE      ELECTROCARDIOGRAM      EVENT MONITOR         Social History  Social History     Socioeconomic History    Marital status: Married   Tobacco Use    Smoking status: Never    Smokeless tobacco: Never   Substance and Sexual Activity    Alcohol use: Not Currently    Drug use: Never       Family History  Family History   Problem Relation Name Age of Onset    Hypertension Mother      Coronary Artery Disease Mother      Diabetes Mother      Cancer Father      Hypertension Father      Coronary Artery Disease Father      Coronary Artery Disease Sister      Heart Attack Sister Diabetes Sister      Diabetes Brother      Heart Surgery Niece      Heart Disease Niece      Arrhythmia Niece      Heart Attack Nephew      Coronary Artery Disease Nephew      Heart Surgery Nephew         Review of Systems    Review of Systems   Constitutional: Positive for malaise/fatigue.   HENT: Negative.     Eyes: Negative.    Cardiovascular:  Positive for chest pain, irregular heartbeat and palpitations.   Respiratory: Negative.     Endocrine: Negative.    Hematologic/Lymphatic: Negative.    Skin: Negative.    Musculoskeletal:  Positive for muscle cramps.   Gastrointestinal:  Positive for heartburn.   Genitourinary: Negative.    Neurological:  Positive for dizziness and light-headedness.   Psychiatric/Behavioral: Negative.     Allergic/Immunologic: Negative.        A 10 point review of systems was performed and was noted to be unremarkable except for facts mentioned in the HPI.    Physical Exam                          Vital Signs: Most Recent   Vitals:    12/03/24 0931 12/03/24 0947 12/03/24 0948 12/03/24 0949   BP: (!) 159/83 (!) 171/86 (!) 167/84 (!) 149/77   BP Source: Arm, Left Upper Arm, Right Upper Arm, Right Upper Arm, Right Upper   Pulse: 56 53 54 54   SpO2: 99%      O2 Device: None (Room air)      PainSc: Zero      Weight: 73.1 kg (161 lb 3.2 oz)      Height: 154.9 cm (5' 1)             Body mass index is 30.46 kg/m?SABRA    General Appearance: moderately overweight, no distress   Neck Veins: normal JVP , neck veins are not distended   Cardiovascular system: Pulse 86/min, regular rhythm , normal volume, no specific character  PMI undisplaced, no palpable thrills/heaves, S1 S2 heard, no murmurs, no rub, no jugular venous distension, no carotid bruit.  Carotid Arteries: normal carotid upstroke bilaterally, no bruits   Respiratory system: No acute distress  No use of accessory muscles  Normal vesicular breath sounds over all the lung fields bilaterally  No added sounds    Lab/Radiology/Other Diagnostic Tests:    Lab results pertaining to the cardiovascular system, relevant cardiovascular imaging was reviewed and independent interpretation is provided as outlined above.    Total billing time recorded today was spent in the following activities: Preparing to see the patient, Obtaining and/or reviewing separately obtained history, Performing a medically appropriate examination and/or evaluation, Counseling and educating the patient/family/caregiver, Ordering medications, tests, or procedures, Referring and communication with other health care professionals (when not separately reported), Documenting clinical information in the electronic or other health record, Independently interpreting results (not separately reported) and communicating results to the patient/family/caregiver, and Care coordination (not separately reported)    This note was dictated using the dragon speech recognition software.  Transcription errors may occur with the use of this software. Editing and proofreading were done by the author of this document.  In spite of the author's best effort to identify every error introduced by voice to text dictation, some errors that may represent misspelling or misstatements of what was dictated may persist.  If there are questions about content in this document please contact Amelie Boots MD
